# Patient Record
Sex: Female | Born: 1985 | Race: Black or African American | Hispanic: No | Marital: Single | Smoking: Never smoker
Health system: Southern US, Community
[De-identification: ages and names within clinical notes are randomized; demographics above are authoritative.]

## PROBLEM LIST (undated history)

## (undated) DIAGNOSIS — E7800 Pure hypercholesterolemia, unspecified: Secondary | ICD-10-CM

## (undated) DIAGNOSIS — E669 Obesity, unspecified: Secondary | ICD-10-CM

## (undated) DIAGNOSIS — N83209 Unspecified ovarian cyst, unspecified side: Secondary | ICD-10-CM

## (undated) DIAGNOSIS — I10 Essential (primary) hypertension: Secondary | ICD-10-CM

## (undated) DIAGNOSIS — E119 Type 2 diabetes mellitus without complications: Secondary | ICD-10-CM

---
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
## 2005-01-22 ENCOUNTER — Other Ambulatory Visit: Admission: RE | Admit: 2005-01-22 | Discharge: 2005-01-22 | Payer: Self-pay | Admitting: Obstetrics and Gynecology

## 2008-02-28 ENCOUNTER — Emergency Department (HOSPITAL_COMMUNITY): Admission: EM | Admit: 2008-02-28 | Discharge: 2008-02-28 | Payer: Self-pay | Admitting: Family Medicine

## 2008-11-18 ENCOUNTER — Emergency Department (HOSPITAL_COMMUNITY): Admission: EM | Admit: 2008-11-18 | Discharge: 2008-11-18 | Payer: Self-pay | Admitting: Family Medicine

## 2009-03-20 ENCOUNTER — Emergency Department (HOSPITAL_COMMUNITY): Admission: EM | Admit: 2009-03-20 | Discharge: 2009-03-20 | Payer: Self-pay | Admitting: Emergency Medicine

## 2010-04-10 LAB — URINALYSIS, ROUTINE W REFLEX MICROSCOPIC
Bilirubin Urine: NEGATIVE
Glucose, UA: NEGATIVE mg/dL
Protein, ur: NEGATIVE mg/dL
Specific Gravity, Urine: 1.024 (ref 1.005–1.030)
Urobilinogen, UA: 0.2 mg/dL (ref 0.0–1.0)

## 2010-04-10 LAB — URINE MICROSCOPIC-ADD ON

## 2010-04-10 LAB — GC/CHLAMYDIA PROBE AMP, GENITAL: GC Probe Amp, Genital: NEGATIVE

## 2010-04-10 LAB — WET PREP, GENITAL: Yeast Wet Prep HPF POC: NONE SEEN

## 2010-04-10 LAB — POCT PREGNANCY, URINE: Preg Test, Ur: NEGATIVE

## 2010-10-29 ENCOUNTER — Inpatient Hospital Stay (INDEPENDENT_AMBULATORY_CARE_PROVIDER_SITE_OTHER)
Admission: RE | Admit: 2010-10-29 | Discharge: 2010-10-29 | Disposition: A | Discharge status: Home / Self Care | Payer: Self-pay | Source: Ambulatory Visit | Attending: Family Medicine | Admitting: Family Medicine

## 2010-10-29 ENCOUNTER — Emergency Department (HOSPITAL_COMMUNITY): Payer: Self-pay

## 2010-10-29 ENCOUNTER — Emergency Department (HOSPITAL_COMMUNITY)
Admission: EM | Admit: 2010-10-29 | Discharge: 2010-10-29 | Disposition: A | Discharge status: Home / Self Care | Payer: Self-pay | Attending: Emergency Medicine | Admitting: Emergency Medicine

## 2010-10-29 DIAGNOSIS — R197 Diarrhea, unspecified: Secondary | ICD-10-CM | POA: Insufficient documentation

## 2010-10-29 DIAGNOSIS — Z79899 Other long term (current) drug therapy: Secondary | ICD-10-CM | POA: Insufficient documentation

## 2010-10-29 DIAGNOSIS — K219 Gastro-esophageal reflux disease without esophagitis: Secondary | ICD-10-CM | POA: Insufficient documentation

## 2010-10-29 DIAGNOSIS — R109 Unspecified abdominal pain: Secondary | ICD-10-CM

## 2010-10-29 DIAGNOSIS — R10819 Abdominal tenderness, unspecified site: Secondary | ICD-10-CM | POA: Insufficient documentation

## 2010-10-29 DIAGNOSIS — E669 Obesity, unspecified: Secondary | ICD-10-CM | POA: Insufficient documentation

## 2010-10-29 LAB — URINE MICROSCOPIC-ADD ON

## 2010-10-29 LAB — DIFFERENTIAL
Basophils Absolute: 0 10*3/uL (ref 0.0–0.1)
Eosinophils Relative: 5 % (ref 0–5)
Lymphocytes Relative: 42 % (ref 12–46)
Lymphs Abs: 3.3 10*3/uL (ref 0.7–4.0)
Monocytes Absolute: 0.4 10*3/uL (ref 0.1–1.0)
Monocytes Relative: 6 % (ref 3–12)
Neutro Abs: 3.6 10*3/uL (ref 1.7–7.7)

## 2010-10-29 LAB — COMPREHENSIVE METABOLIC PANEL
ALT: 30 U/L (ref 0–35)
AST: 24 U/L (ref 0–37)
Albumin: 3.4 g/dL — ABNORMAL LOW (ref 3.5–5.2)
CO2: 23 mEq/L (ref 19–32)
Calcium: 9.3 mg/dL (ref 8.4–10.5)
Chloride: 102 mEq/L (ref 96–112)
Creatinine, Ser: 0.69 mg/dL (ref 0.50–1.10)
GFR calc non Af Amer: >90 mL/min (ref >90)
Sodium: 136 mEq/L (ref 135–145)
Total Bilirubin: 0.2 mg/dL — ABNORMAL LOW (ref 0.3–1.2)

## 2010-10-29 LAB — POCT URINALYSIS DIP (DEVICE)
Hgb urine dipstick: NEGATIVE
Leukocytes, UA: NEGATIVE
Protein, ur: NEGATIVE mg/dL
pH: 7 (ref 5.0–8.0)

## 2010-10-29 LAB — URINALYSIS, ROUTINE W REFLEX MICROSCOPIC
Glucose, UA: NEGATIVE mg/dL
Hgb urine dipstick: NEGATIVE
Ketones, ur: NEGATIVE mg/dL
Protein, ur: NEGATIVE mg/dL
pH: 6.5 (ref 5.0–8.0)

## 2010-10-29 LAB — CBC
HCT: 39.5 % (ref 36.0–46.0)
Hemoglobin: 13 g/dL (ref 12.0–15.0)
MCH: 26.6 pg (ref 26.0–34.0)
MCHC: 32.9 g/dL (ref 30.0–36.0)
MCV: 80.8 fL (ref 78.0–100.0)
RDW: 13.7 % (ref 11.5–15.5)

## 2010-11-30 ENCOUNTER — Encounter: Payer: Self-pay | Admitting: Nurse Practitioner

## 2010-11-30 ENCOUNTER — Emergency Department (HOSPITAL_COMMUNITY)
Admission: EM | Admit: 2010-11-30 | Discharge: 2010-11-30 | Disposition: A | Discharge status: Home / Self Care | Payer: BC Managed Care – PPO | Attending: Emergency Medicine | Admitting: Emergency Medicine

## 2010-11-30 ENCOUNTER — Emergency Department (HOSPITAL_COMMUNITY): Payer: BC Managed Care – PPO

## 2010-11-30 DIAGNOSIS — X58XXXA Exposure to other specified factors, initial encounter: Secondary | ICD-10-CM | POA: Insufficient documentation

## 2010-11-30 DIAGNOSIS — M25569 Pain in unspecified knee: Secondary | ICD-10-CM | POA: Insufficient documentation

## 2010-11-30 DIAGNOSIS — IMO0002 Reserved for concepts with insufficient information to code with codable children: Secondary | ICD-10-CM | POA: Insufficient documentation

## 2010-11-30 DIAGNOSIS — S8390XA Sprain of unspecified site of unspecified knee, initial encounter: Secondary | ICD-10-CM

## 2010-11-30 DIAGNOSIS — Z79899 Other long term (current) drug therapy: Secondary | ICD-10-CM | POA: Insufficient documentation

## 2010-11-30 NOTE — Progress Notes (Signed)
Orthopedic Tech Progress Note Patient Details:  Charlene Jordan 1985/12/22 [id number]  Other Ortho Devices Type of Ortho Device: Crutches   [name] 11/30/2010, 4:17 PM

## 2010-11-30 NOTE — ED Notes (Signed)
Paged Ortho  

## 2010-11-30 NOTE — ED Notes (Signed)
Onset today 0100 patient attempting to sit down patient heard a pop left knee.  States pain 0/10 at rest and with movement pain 8/10. Pedal pulses +2 full sensation able to move all toes equal.

## 2010-11-30 NOTE — ED Notes (Signed)
C/o left knee pain since she "twisted it" sitting down last night. ambulatory

## 2010-11-30 NOTE — ED Notes (Signed)
Ortho gave crutches and ace bandage.  Ice pack was given upon nurse assessment. Verbalized understanding

## 2010-11-30 NOTE — ED Provider Notes (Signed)
History     CSN: [id number] Arrival date & time: 11/30/2010  3:01 PM   First MD Initiated Contact with Patient 11/30/10 1523      Chief Complaint  Patient presents with  . Knee Pain    (Consider location/radiation/quality/duration/timing/severity/associated sxs/prior treatment) Patient is a 25 y.o. female presenting with knee pain. The history is provided by the patient.  Knee Pain This is a new problem. The current episode started 3 to 5 hours ago. The problem occurs constantly. The problem has not changed since onset.The symptoms are aggravated by bending, twisting and walking. The symptoms are relieved by nothing. She has tried a cold compress and rest for the symptoms. The treatment provided mild relief.    History reviewed. No pertinent past medical history.  History reviewed. No pertinent past surgical history.  History reviewed. No pertinent family history.  History  Substance Use Topics  . Smoking status: Never Smoker   . Smokeless tobacco: Not on file  . Alcohol Use: No    OB History    Grav Para Term Preterm Abortions TAB SAB Ect Mult Living                  Review of Systems  All other systems reviewed and are negative.    Allergies  Review of patient's allergies indicates no known allergies.  Home Medications   Current Outpatient Rx  Name Route Sig Dispense Refill  . OMEPRAZOLE 20 MG PO CPDR Oral Take 20 mg by mouth daily.        BP 147/98  Pulse 98  Temp(Src) 98.2 F (36.8 C) (Oral)  Resp 18  Ht 5\' 7"  (1.702 m)  Wt 300 lb (136.079 kg)  BMI 46.99 kg/m2  SpO2 95%  Physical Exam  Nursing note and vitals reviewed. Constitutional: She is oriented to person, place, and time. She appears well-developed and well-nourished. No distress.  HENT:  Head: Normocephalic and atraumatic.  Musculoskeletal:       Left knee: She exhibits decreased range of motion and bony tenderness. She exhibits no swelling, no deformity, normal alignment, no LCL  laxity, normal patellar mobility and no MCL laxity. tenderness found. Medial joint line tenderness noted.  Neurological: She is alert and oriented to person, place, and time.  Skin: Skin is warm and dry.    ED Course  Procedures (including critical care time)  Labs Reviewed - No data to display Dg Knee Complete 4 Views Left  11/30/2010  *RADIOLOGY REPORT*  Clinical Data: Twisting injury.  Pain.  LEFT KNEE - COMPLETE 4+ VIEW  Comparison: None.  Findings: Imaged bones, joints and soft tissues appear normal.  IMPRESSION: Negative study.  Original Report Authenticated By: Bernadene Bell. Maricela Curet, M.D.     No diagnosis found.    MDM   Pt with knee that twisted and popped while sitting down and lots of pain with walking.  No effusion and plain films neg.  Will have f/u if persistent pain adn give crutches and ace wrap.        Gwyneth Sprout, MD 11/30/10 [phone]

## 2012-12-14 ENCOUNTER — Emergency Department (HOSPITAL_COMMUNITY): Payer: BC Managed Care – PPO

## 2012-12-14 ENCOUNTER — Encounter (HOSPITAL_COMMUNITY): Payer: Self-pay | Admitting: Emergency Medicine

## 2012-12-14 ENCOUNTER — Emergency Department (HOSPITAL_COMMUNITY)
Admission: EM | Admit: 2012-12-14 | Discharge: 2012-12-14 | Disposition: A | Discharge status: Home / Self Care | Payer: BC Managed Care – PPO | Attending: Emergency Medicine | Admitting: Emergency Medicine

## 2012-12-14 DIAGNOSIS — J029 Acute pharyngitis, unspecified: Secondary | ICD-10-CM | POA: Insufficient documentation

## 2012-12-14 DIAGNOSIS — J209 Acute bronchitis, unspecified: Secondary | ICD-10-CM | POA: Insufficient documentation

## 2012-12-14 DIAGNOSIS — E669 Obesity, unspecified: Secondary | ICD-10-CM | POA: Insufficient documentation

## 2012-12-14 DIAGNOSIS — Z79899 Other long term (current) drug therapy: Secondary | ICD-10-CM | POA: Insufficient documentation

## 2012-12-14 DIAGNOSIS — J40 Bronchitis, not specified as acute or chronic: Secondary | ICD-10-CM

## 2012-12-14 DIAGNOSIS — IMO0001 Reserved for inherently not codable concepts without codable children: Secondary | ICD-10-CM | POA: Insufficient documentation

## 2012-12-14 HISTORY — DX: Obesity, unspecified: E66.9

## 2012-12-14 MED ORDER — AZITHROMYCIN 250 MG PO TABS: 250.0000 mg | ORAL_TABLET | Freq: Every day | ORAL | Status: DC

## 2012-12-14 MED ORDER — AZITHROMYCIN 250 MG PO TABS
500.0000 mg | ORAL_TABLET | Freq: Once | ORAL | Status: AC
  Administered 2012-12-14: 500 mg via ORAL
  Filled 2012-12-14: qty 2

## 2012-12-14 MED ORDER — HYDROCOD POLST-CHLORPHEN POLST 10-8 MG/5ML PO LQCR: 5.0000 mL | Freq: Two times a day (BID) | ORAL | Status: DC | PRN

## 2012-12-14 NOTE — ED Notes (Signed)
Pt. reports persistent productive cough with generalized weakness for several days , denies fever or chills, respirations unlabored .

## 2012-12-14 NOTE — ED Provider Notes (Signed)
CSN: [id number]     Arrival date & time 12/14/12  0156 History   First MD Initiated Contact with Patient 12/14/12 0314     Chief Complaint  Patient presents with  . Cough   (Consider location/radiation/quality/duration/timing/severity/associated sxs/prior Treatment) Patient is a 27 y.o. female presenting with cough. The history is provided by the patient.  Cough Cough characteristics:  Productive Sputum characteristics:  Clear and yellow Severity:  Moderate Duration:  10 days Timing:  Constant Progression:  Worsening Chronicity:  New Smoker: no   Context: sick contacts and weather changes   Context: not fumes, not smoke exposure and not with activity   Relieved by:  Nothing Worsened by:  Environmental changes and exposure to cold air Ineffective treatments:  Decongestant and cough suppressants Associated symptoms: chills, fever, myalgias, sinus congestion and sore throat   Associated symptoms: no chest pain and no wheezing     Past Medical History  Diagnosis Date  . Obesity    History reviewed. No pertinent past surgical history. History reviewed. No pertinent family history. History  Substance Use Topics  . Smoking status: Never Smoker   . Smokeless tobacco: Not on file  . Alcohol Use: No   OB History   Grav Para Term Preterm Abortions TAB SAB Ect Mult Living                 Review of Systems  Constitutional: Positive for fever and chills.  HENT: Positive for congestion, postnasal drip and sore throat.   Respiratory: Positive for cough. Negative for wheezing.   Cardiovascular: Negative for chest pain.  Gastrointestinal: Negative for nausea, vomiting and abdominal pain.  Musculoskeletal: Positive for myalgias.    Allergies  Review of patient's allergies indicates no known allergies.  Home Medications   Current Outpatient Rx  Name  Route  Sig  Dispense  Refill  . azithromycin (ZITHROMAX) 250 MG tablet   Oral   Take 1 tablet (250 mg total) by mouth  daily.   4 tablet   0   . chlorpheniramine-HYDROcodone (TUSSIONEX PENNKINETIC ER) 10-8 MG/5ML LQCR   Oral   Take 5 mLs by mouth every 12 (twelve) hours as needed for cough.   80 mL   0   . omeprazole (PRILOSEC) 20 MG capsule   Oral   Take 20 mg by mouth daily.            BP 128/101  Pulse 113  Temp(Src) 99.3 F (37.4 C) (Oral)  Resp 18  SpO2 99%  LMP 10/29/2012 Physical Exam  Nursing note and vitals reviewed. Constitutional: She is oriented to person, place, and time. She appears well-developed and well-nourished. No distress.  HENT:  Head: Normocephalic and atraumatic.  Eyes: Conjunctivae and EOM are normal.  Neck: Normal range of motion. Neck supple.  Cardiovascular: Regular rhythm and intact distal pulses.   Pulmonary/Chest: Effort normal. No respiratory distress. She has no wheezes. She has no rales.  coughing  Abdominal: Soft. Bowel sounds are normal.  Neurological: She is alert and oriented to person, place, and time.  Skin: Skin is warm.  Psychiatric: She has a normal mood and affect.    ED Course  Procedures (including critical care time) Labs Review Labs Reviewed - No data to display Imaging Review No results found.  EKG Interpretation   None      ra sat is 99% and I interpret to be normal   I reviewed 2 view PA and lateral CXR, no overt infiltrates seen, no PTX,  no free air.  Poor inspiration.   MDM   1. Bronchitis       Pt with persistent cough for > 1 week, will try inhaler, cough medication and Z pak.  Pt understands and agrees with treatment.  Mild tachycardia, may be due to illness and low grade fever.  Pt has no CP, rhythm is regular.      Gavin Pound. Oletta Lamas, MD 12/14/12 [phone]

## 2012-12-19 ENCOUNTER — Encounter (HOSPITAL_COMMUNITY): Payer: Self-pay | Admitting: Emergency Medicine

## 2012-12-19 ENCOUNTER — Emergency Department (HOSPITAL_COMMUNITY)
Admission: EM | Admit: 2012-12-19 | Discharge: 2012-12-19 | Disposition: A | Discharge status: Home / Self Care | Payer: BC Managed Care – PPO | Attending: Emergency Medicine | Admitting: Emergency Medicine

## 2012-12-19 DIAGNOSIS — R52 Pain, unspecified: Secondary | ICD-10-CM

## 2012-12-19 DIAGNOSIS — M2550 Pain in unspecified joint: Secondary | ICD-10-CM | POA: Insufficient documentation

## 2012-12-19 DIAGNOSIS — Z792 Long term (current) use of antibiotics: Secondary | ICD-10-CM | POA: Insufficient documentation

## 2012-12-19 DIAGNOSIS — E876 Hypokalemia: Secondary | ICD-10-CM | POA: Insufficient documentation

## 2012-12-19 DIAGNOSIS — IMO0001 Reserved for inherently not codable concepts without codable children: Secondary | ICD-10-CM | POA: Insufficient documentation

## 2012-12-19 DIAGNOSIS — E669 Obesity, unspecified: Secondary | ICD-10-CM | POA: Insufficient documentation

## 2012-12-19 LAB — CBC WITH DIFFERENTIAL/PLATELET
Basophils Absolute: 0 10*3/uL (ref 0.0–0.1)
Basophils Relative: 0 % (ref 0–1)
HCT: 34.4 % — ABNORMAL LOW (ref 36.0–46.0)
MCH: 25.4 pg — ABNORMAL LOW (ref 26.0–34.0)
MCHC: 31.4 g/dL (ref 30.0–36.0)
MCV: 80.9 fL (ref 78.0–100.0)
Monocytes Relative: 8 % (ref 3–12)
Neutro Abs: 3.7 10*3/uL (ref 1.7–7.7)
Neutrophils Relative %: 57 % (ref 43–77)
RBC: 4.25 MIL/uL (ref 3.87–5.11)
RDW: 14.2 % (ref 11.5–15.5)
WBC: 6.4 10*3/uL (ref 4.0–10.5)

## 2012-12-19 LAB — POCT I-STAT, CHEM 8
Chloride: 99 mEq/L (ref 96–112)
Glucose, Bld: 106 mg/dL — ABNORMAL HIGH (ref 70–99)
HCT: 37 % (ref 36.0–46.0)
Hemoglobin: 12.6 g/dL (ref 12.0–15.0)
Potassium: 3 mEq/L — ABNORMAL LOW (ref 3.5–5.1)
TCO2: 27 mmol/L (ref 0–100)

## 2012-12-19 MED ORDER — KETOROLAC TROMETHAMINE 60 MG/2ML IM SOLN
60.0000 mg | Freq: Once | INTRAMUSCULAR | Status: AC
  Administered 2012-12-19: 60 mg via INTRAMUSCULAR
  Filled 2012-12-19: qty 2

## 2012-12-19 MED ORDER — POTASSIUM CHLORIDE CRYS ER 20 MEQ PO TBCR
40.0000 meq | EXTENDED_RELEASE_TABLET | Freq: Once | ORAL | Status: AC
  Administered 2012-12-19: 40 meq via ORAL
  Filled 2012-12-19 (×2): qty 2

## 2012-12-19 NOTE — ED Provider Notes (Signed)
Medical screening examination/treatment/procedure(s) were performed by non-physician practitioner and as supervising physician I was immediately available for consultation/collaboration.  Vetra Shinall T Alassane Kalafut, MD 12/19/12 0526 

## 2012-12-19 NOTE — ED Notes (Signed)
Pt. reports generalized body aches onset last night , denies fever or chills, pt. was seen here last Saturday - diagnosed with Bronchitis discharged home with prescription Z-Pack . Respirations unlabored .

## 2012-12-19 NOTE — ED Provider Notes (Signed)
CSN: [id number]     Arrival date & time 12/19/12  0133 History   First MD Initiated Contact with Patient 12/19/12 0148     Chief Complaint  Patient presents with  . Generalized Body Aches   (Consider location/radiation/quality/duration/timing/severity/associated sxs/prior Treatment) HPI Charlene Jordan is a 27 y.o. female who presents to emergency department complaining of diffuse body aches. Patient states she has had cough and congestion for several days, was seen here four days ago was started on Tussionex and Z-Pak. States had chest x-ray done that was negative. States as the day after she started taking medications she started having pain in all muscles and joints. States pain is severe, dull, throbbing. States it's not relieved with BC powder or ibuprofen. Patient states she has never had similar pain in the past. She states that her cough is actually improved with taking medication. Patient denies any fever. Denies any congestion, nausea, vomiting, diarrhea. No chest pain. No dizziness.  Past Medical History  Diagnosis Date  . Obesity    History reviewed. No pertinent past surgical history. No family history on file. History  Substance Use Topics  . Smoking status: Never Smoker   . Smokeless tobacco: Not on file  . Alcohol Use: No   OB History   Grav Para Term Preterm Abortions TAB SAB Ect Mult Living                 Review of Systems  Constitutional: Negative for fever and chills.  Respiratory: Negative for cough, chest tightness and shortness of breath.   Cardiovascular: Negative for chest pain, palpitations and leg swelling.  Gastrointestinal: Negative for nausea, vomiting, abdominal pain and diarrhea.  Genitourinary: Negative for dysuria and flank pain.  Musculoskeletal: Positive for arthralgias and myalgias. Negative for back pain, joint swelling, neck pain and neck stiffness.  Skin: Negative for rash.  Neurological: Negative for dizziness, weakness and headaches.   All other systems reviewed and are negative.    Allergies  Review of patient's allergies indicates no known allergies.  Home Medications   Current Outpatient Rx  Name  Route  Sig  Dispense  Refill  . acetaminophen (TYLENOL) 325 MG tablet   Oral   Take 650 mg by mouth every 6 (six) hours as needed for mild pain.         . Chlorphen-Phenyleph-ASA (ALKA-SELTZER PLUS COLD PO)   Oral   Take 1 tablet by mouth every 6 (six) hours as needed (cold symptoms).         . chlorpheniramine-HYDROcodone (TUSSIONEX PENNKINETIC ER) 10-8 MG/5ML LQCR   Oral   Take 5 mLs by mouth every 12 (twelve) hours as needed for cough.   80 mL   0   . azithromycin (ZITHROMAX) 250 MG tablet   Oral   Take 1 tablet (250 mg total) by mouth daily.   4 tablet   0    BP 125/68  Pulse 94  Temp(Src) 98.9 F (37.2 C) (Oral)  Resp 14  Ht 5\' 8"  (1.727 m)  Wt 313 lb (141.976 kg)  BMI 47.60 kg/m2  SpO2 99%  LMP 10/29/2012 Physical Exam  Nursing note and vitals reviewed. Constitutional: She is oriented to person, place, and time. She appears well-developed and well-nourished. No distress.  HENT:  Head: Normocephalic.  Right Ear: External ear normal.  Left Ear: External ear normal.  Nose: Nose normal.  Mouth/Throat: Oropharynx is clear and moist.  Eyes: Conjunctivae are normal.  Neck: Neck supple.  Cardiovascular: Normal rate,  regular rhythm and normal heart sounds.   Pulmonary/Chest: Effort normal and breath sounds normal. No respiratory distress. She has no wheezes. She has no rales.  Abdominal: Soft. Bowel sounds are normal. She exhibits no distension. There is no tenderness. There is no rebound.  Musculoskeletal: She exhibits no edema.  Neurological: She is alert and oriented to person, place, and time.  Skin: Skin is warm and dry.  Psychiatric: She has a normal mood and affect. Her behavior is normal.    ED Course  Procedures (including critical care time) Labs Review Labs Reviewed  CBC  WITH DIFFERENTIAL - Abnormal; Notable for the following:    Hemoglobin 10.8 (*)    HCT 34.4 (*)    MCH 25.4 (*)    All other components within normal limits  POCT I-STAT, CHEM 8 - Abnormal; Notable for the following:    Potassium 3.0 (*)    Glucose, Bld 106 (*)    Calcium, Ion 1.11 (*)    All other components within normal limits  CK   Imaging Review No results found.  EKG Interpretation   None       MDM   1. Body aches   2. Hypokalemia     Patient's with bodyaches after starting a Z-Pak. Patient is now down with Z-Pak. States her cough and upper respiratory symptoms improved but body aches continue. Today she's afebrile. Vital signs are all within normal. Labs and CK total obtained and everything is unremarkable except for hemoglobin of 10.8, potassium of 3, calcium of 1.11. I have given her 40 mEq of potassium in emergency department, Toradol 60 mg IM. I suspect her anemia is chronic. She will need followup with primary care physician. I suspect her muscle aches could be do to a viral infection versus medication side effect.  Filed Vitals:   12/19/12 0141  BP: 125/68  Pulse: 94  Temp: 98.9 F (37.2 C)  TempSrc: Oral  Resp: 14  Height: 5\' 8"  (1.727 m)  Weight: 313 lb (141.976 kg)  SpO2: 99%     Lottie Mussel, PA-C 12/19/12 0344

## 2014-04-22 ENCOUNTER — Emergency Department
Admit: 2014-04-22 | Disposition: A | Discharge status: Home / Self Care | Payer: Self-pay | Admitting: Emergency Medicine

## 2014-06-23 ENCOUNTER — Encounter: Payer: Self-pay | Admitting: Emergency Medicine

## 2014-06-23 ENCOUNTER — Emergency Department
Admission: EM | Admit: 2014-06-23 | Discharge: 2014-06-23 | Disposition: A | Discharge status: Home / Self Care | Payer: BLUE CROSS/BLUE SHIELD | Attending: Emergency Medicine | Admitting: Emergency Medicine

## 2014-06-23 ENCOUNTER — Emergency Department: Payer: BLUE CROSS/BLUE SHIELD

## 2014-06-23 DIAGNOSIS — R1110 Vomiting, unspecified: Secondary | ICD-10-CM | POA: Diagnosis not present

## 2014-06-23 DIAGNOSIS — Z3202 Encounter for pregnancy test, result negative: Secondary | ICD-10-CM | POA: Insufficient documentation

## 2014-06-23 DIAGNOSIS — R109 Unspecified abdominal pain: Secondary | ICD-10-CM

## 2014-06-23 DIAGNOSIS — R1031 Right lower quadrant pain: Secondary | ICD-10-CM | POA: Insufficient documentation

## 2014-06-23 LAB — CBC
HEMATOCRIT: 34.5 % — AB (ref 35.0–47.0)
Hemoglobin: 10.9 g/dL — ABNORMAL LOW (ref 12.0–16.0)
MCH: 24.2 pg — ABNORMAL LOW (ref 26.0–34.0)
MCHC: 31.6 g/dL — ABNORMAL LOW (ref 32.0–36.0)
MCV: 76.7 fL — AB (ref 80.0–100.0)
PLATELETS: 249 10*3/uL (ref 150–440)
RBC: 4.5 MIL/uL (ref 3.80–5.20)
RDW: 14.8 % — ABNORMAL HIGH (ref 11.5–14.5)
WBC: 8.1 10*3/uL (ref 3.6–11.0)

## 2014-06-23 LAB — URINALYSIS COMPLETE WITH MICROSCOPIC (ARMC ONLY)
BILIRUBIN URINE: NEGATIVE
Glucose, UA: NEGATIVE mg/dL
Hgb urine dipstick: NEGATIVE
Ketones, ur: NEGATIVE mg/dL
LEUKOCYTES UA: NEGATIVE
NITRITE: NEGATIVE
Protein, ur: 30 mg/dL — AB
Specific Gravity, Urine: 1.017 (ref 1.005–1.030)
pH: 9 — ABNORMAL HIGH (ref 5.0–8.0)

## 2014-06-23 LAB — BASIC METABOLIC PANEL
Anion gap: 8 (ref 5–15)
BUN: 10 mg/dL (ref 6–20)
CO2: 27 mmol/L (ref 22–32)
Calcium: 8.7 mg/dL — ABNORMAL LOW (ref 8.9–10.3)
Chloride: 102 mmol/L (ref 101–111)
Creatinine, Ser: 0.86 mg/dL (ref 0.44–1.00)
GFR calc Af Amer: >60 mL/min (ref >60)
GFR calc non Af Amer: >60 mL/min (ref >60)
Glucose, Bld: 108 mg/dL — ABNORMAL HIGH (ref 65–99)
POTASSIUM: 3.5 mmol/L (ref 3.5–5.1)
Sodium: 137 mmol/L (ref 135–145)

## 2014-06-23 LAB — POCT PREGNANCY, URINE: Preg Test, Ur: NEGATIVE

## 2014-06-23 MED ORDER — KETOROLAC TROMETHAMINE 60 MG/2ML IM SOLN
60.0000 mg | Freq: Once | INTRAMUSCULAR | Status: AC
  Administered 2014-06-23: 60 mg via INTRAMUSCULAR

## 2014-06-23 MED ORDER — ONDANSETRON 8 MG PO TBDP
8.0000 mg | ORAL_TABLET | Freq: Once | ORAL | Status: AC
  Administered 2014-06-23: 8 mg via ORAL

## 2014-06-23 MED ORDER — KETOROLAC TROMETHAMINE 60 MG/2ML IM SOLN
INTRAMUSCULAR | Status: AC
  Administered 2014-06-23: 60 mg via INTRAMUSCULAR
  Filled 2014-06-23: qty 2

## 2014-06-23 MED ORDER — MORPHINE SULFATE 4 MG/ML IJ SOLN
4.0000 mg | Freq: Once | INTRAMUSCULAR | Status: AC
Start: 2014-06-23 — End: 2014-06-23
  Administered 2014-06-23: 4 mg via INTRAMUSCULAR

## 2014-06-23 MED ORDER — IBUPROFEN 600 MG PO TABS: 600.0000 mg | ORAL_TABLET | Freq: Three times a day (TID) | ORAL | Status: DC | PRN

## 2014-06-23 MED ORDER — MORPHINE SULFATE 4 MG/ML IJ SOLN
INTRAMUSCULAR | Status: AC
  Filled 2014-06-23: qty 1

## 2014-06-23 MED ORDER — MORPHINE SULFATE 4 MG/ML IJ SOLN
INTRAMUSCULAR | Status: AC
  Administered 2014-06-23: 4 mg via INTRAMUSCULAR
  Filled 2014-06-23: qty 1

## 2014-06-23 MED ORDER — ONDANSETRON 8 MG PO TBDP
ORAL_TABLET | ORAL | Status: AC
  Administered 2014-06-23: 8 mg via ORAL
  Filled 2014-06-23: qty 1

## 2014-06-23 NOTE — ED Notes (Signed)
Patient ambulatory to triage with steady gait, without difficulty or distress noted; pt reports since 445am having right flank pain accomp by N/V

## 2014-06-23 NOTE — Discharge Instructions (Signed)
Please seek medical attention for any high fevers, chest pain, shortness of breath, change in behavior, persistent vomiting, bloody stool or any other new or concerning symptoms. ° °Abdominal Pain, Women °Abdominal (stomach, pelvic, or belly) pain can be caused by many things. It is important to tell your doctor: °· The location of the pain. °· Does it come and go or is it present all the time? °· Are there things that start the pain (eating certain foods, exercise)? °· Are there other symptoms associated with the pain (fever, nausea, vomiting, diarrhea)? °All of this is helpful to know when trying to find the cause of the pain. °CAUSES  °· Stomach: virus or bacteria infection, or ulcer. °· Intestine: appendicitis (inflamed appendix), regional ileitis (Crohn's disease), ulcerative colitis (inflamed colon), irritable bowel syndrome, diverticulitis (inflamed diverticulum of the colon), or cancer of the stomach or intestine. °· Gallbladder disease or stones in the gallbladder. °· Kidney disease, kidney stones, or infection. °· Pancreas infection or cancer. °· Fibromyalgia (pain disorder). °· Diseases of the female organs: °¨ Uterus: fibroid (non-cancerous) tumors or infection. °¨ Fallopian tubes: infection or tubal pregnancy. °¨ Ovary: cysts or tumors. °¨ Pelvic adhesions (scar tissue). °¨ Endometriosis (uterus lining tissue growing in the pelvis and on the pelvic organs). °¨ Pelvic congestion syndrome (female organs filling up with blood just before the menstrual period). °¨ Pain with the menstrual period. °¨ Pain with ovulation (producing an egg). °¨ Pain with an IUD (intrauterine device, birth control) in the uterus. °¨ Cancer of the female organs. °· Functional pain (pain not caused by a disease, may improve without treatment). °· Psychological pain. °· Depression. °DIAGNOSIS  °Your doctor will decide the seriousness of your pain by doing an examination. °· Blood tests. °· X-rays. °· Ultrasound. °· CT scan  (computed tomography, special type of X-ray). °· MRI (magnetic resonance imaging). °· Cultures, for infection. °· Barium enema (dye inserted in the large intestine, to better view it with X-rays). °· Colonoscopy (looking in intestine with a lighted tube). °· Laparoscopy (minor surgery, looking in abdomen with a lighted tube). °· Major abdominal exploratory surgery (looking in abdomen with a large incision). °TREATMENT  °The treatment will depend on the cause of the pain.  °· Many cases can be observed and treated at home. °· Over-the-counter medicines recommended by your caregiver. °· Prescription medicine. °· Antibiotics, for infection. °· Birth control pills, for painful periods or for ovulation pain. °· Hormone treatment, for endometriosis. °· Nerve blocking injections. °· Physical therapy. °· Antidepressants. °· Counseling with a psychologist or psychiatrist. °· Minor or major surgery. °HOME CARE INSTRUCTIONS  °· Do not take laxatives, unless directed by your caregiver. °· Take over-the-counter pain medicine only if ordered by your caregiver. Do not take aspirin because it can cause an upset stomach or bleeding. °· Try a clear liquid diet (broth or water) as ordered by your caregiver. Slowly move to a bland diet, as tolerated, if the pain is related to the stomach or intestine. °· Have a thermometer and take your temperature several times a day, and record it. °· Bed rest and sleep, if it helps the pain. °· Avoid sexual intercourse, if it causes pain. °· Avoid stressful situations. °· Keep your follow-up appointments and tests, as your caregiver orders. °· If the pain does not go away with medicine or surgery, you may try: °¨ Acupuncture. °¨ Relaxation exercises (yoga, meditation). °¨ Group therapy. °¨ Counseling. °SEEK MEDICAL CARE IF:  °· You notice certain foods cause stomach   pain. °· Your home care treatment is not helping your pain. °· You need stronger pain medicine. °· You want your IUD removed. °· You  feel faint or lightheaded. °· You develop nausea and vomiting. °· You develop a rash. °· You are having side effects or an allergy to your medicine. °SEEK IMMEDIATE MEDICAL CARE IF:  °· Your pain does not go away or gets worse. °· You have a fever. °· Your pain is felt only in portions of the abdomen. The right side could possibly be appendicitis. The left lower portion of the abdomen could be colitis or diverticulitis. °· You are passing blood in your stools (bright red or black tarry stools, with or without vomiting). °· You have blood in your urine. °· You develop chills, with or without a fever. °· You pass out. °MAKE SURE YOU:  °· Understand these instructions. °· Will watch your condition. °· Will get help right away if you are not doing well or get worse. °Document Released: 10/29/2006 Document Revised: 05/18/2013 Document Reviewed: 11/18/2008 °[employer]® Patient Information ©2015 [employer]. This information is not intended to replace advice given to you by your health care provider. Make sure you discuss any questions you have with your health care provider. ° °

## 2014-06-23 NOTE — ED Provider Notes (Signed)
[facility] Emergency Department Provider Note   ____________________________________________  Time seen: 0745  I have reviewed the triage vital signs and the nursing notes.   HISTORY  Chief Complaint Flank Pain   History limited by: Not Limited   HPI Jamison Neighborshley Brummell is a 29 y.o. female resents to the emergency department today with right flank pain. The patient states the flank pain started roughly 3 hours ago. It was sudden on onset. She describes it as sharp. It has some radiation down to the groin. Patient had similar pain in the past when she was diagnosed with an ovarian cyst. She has had some associated vomiting. Patient denies any fevers, chest pain.     History reviewed. No pertinent past medical history.  There are no active problems to display for this patient.   History reviewed. No pertinent past surgical history.  Current Outpatient Rx  Name  Route  Sig  Dispense  Refill  . ibuprofen (ADVIL,MOTRIN) 600 MG tablet   Oral   Take 1 tablet (600 mg total) by mouth every 8 (eight) hours as needed.   20 tablet   0     Allergies Doxycycline  No family history on file.  Social History History  Substance Use Topics  . Smoking status: Never Smoker   . Smokeless tobacco: Not on file  . Alcohol Use: No    Review of Systems  Constitutional: Negative for fever. Cardiovascular: Negative for chest pain. Respiratory: Negative for shortness of breath. Gastrointestinal: Right flank pain, vomiting Genitourinary: Negative for dysuria. Musculoskeletal: Negative for back pain. Skin: Negative for rash. Neurological: Negative for headaches, focal weakness or numbness.   10-point ROS otherwise negative.  ____________________________________________   PHYSICAL EXAM:  VITAL SIGNS: ED Triage Vitals  Enc Vitals Group     BP 06/23/14 0558 137/93 mmHg     Pulse Rate 06/23/14 0558 86     Resp 06/23/14 0558 18     Temp 06/23/14 0558 98  F (36.7 C)     Temp Source 06/23/14 0558 Oral     SpO2 06/23/14 0558 98 %     Weight 06/23/14 0558 315 lb (142.883 kg)     Height 06/23/14 0558 5\' 7"  (1.702 m)     Head Cir --      Peak Flow --      Pain Score 06/23/14 0602 10   Constitutional: Alert and oriented. Well appearing and in no distress. Eyes: Conjunctivae are normal. PERRL. Normal extraocular movements. ENT   Head: Normocephalic and atraumatic.   Nose: No congestion/rhinnorhea.   Mouth/Throat: Mucous membranes are moist.   Neck: No stridor. Hematological/Lymphatic/Immunilogical: No cervical lymphadenopathy. Cardiovascular: Normal rate, regular rhythm.  Respiratory: Normal respiratory effort without tachypnea nor retractions.  Gastrointestinal: Soft and minimally tender to palpation in the right lower quadrant. No distention.  Genitourinary: Deferred Musculoskeletal: Normal range of motion in all extremities. No joint effusions.  No lower extremity tenderness nor edema. Neurologic:  Normal speech and language. No gross focal neurologic deficits are appreciated. Speech is normal.  Skin:  Skin is warm, dry and intact. No rash noted. Psychiatric: Mood and affect are normal. Speech and behavior are normal. Patient exhibits appropriate insight and judgment.  ____________________________________________    LABS (pertinent positives/negatives)  Labs Reviewed  BASIC METABOLIC PANEL - Abnormal; Notable for the following:    Glucose, Bld 108 (*)    Calcium 8.7 (*)    All other components within normal limits  CBC - Abnormal; Notable for  the following:    Hemoglobin 10.9 (*)    HCT 34.5 (*)    MCV 76.7 (*)    MCH 24.2 (*)    MCHC 31.6 (*)    RDW 14.8 (*)    All other components within normal limits  URINALYSIS COMPLETEWITH MICROSCOPIC ([facility] ONLY) - Abnormal; Notable for the following:    Color, Urine YELLOW (*)    APPearance HAZY (*)    pH 9.0 (*)    Protein, ur 30 (*)    Bacteria, UA RARE (*)     Squamous Epithelial / LPF 6-30 (*)    All other components within normal limits  POC URINE PREG, ED  POCT PREGNANCY, URINE     ____________________________________________   EKG  None  ____________________________________________    RADIOLOGY  None  ____________________________________________   PROCEDURES  Procedure(s) performed: None  Critical Care performed: No  ____________________________________________   INITIAL IMPRESSION / ASSESSMENT AND PLAN / ED COURSE  Pertinent labs & imaging results that were available during my care of the patient were reviewed by me and considered in my medical decision making (see chart for details).  Patient presents to the emergency department with right flank pain. On exam minimally tender in the right lower quadrant and right flank. Patient afebrile. No leukocytosis. I have low suspicion for appendicitis at this point. Reviewed CT scan results from April they did not show any ureterolithiasis. At this point will treat symptomatically and reassess.  Ultrasound does show a small ovarian cyst. No signs of torsion. Discussed with patient that I have low suspicion for appendicitis given lack of fever and leukocytosis. Also discussed low suspicion for kidney stone given CT scan that did not show kidney stones recently as well as no blood in the urine. Patient verbalized understanding. Also discussed with patient my hesitancy to prescribe narcotic pain medication given the addictive and respiratory depression properties. Will give prescription for high-dose ibuprofen.  ____________________________________________   FINAL CLINICAL IMPRESSION(S) / ED DIAGNOSES  Final diagnoses:  Flank pain  Right lower quadrant pain  Right lower quadrant pain     Phineas Semen, MD 06/23/14 1334

## 2014-06-24 ENCOUNTER — Observation Stay (HOSPITAL_COMMUNITY)
Admission: EM | Admit: 2014-06-24 | Discharge: 2014-06-25 | Disposition: A | Discharge status: Home / Self Care | Payer: BLUE CROSS/BLUE SHIELD | Attending: Obstetrics and Gynecology | Admitting: Obstetrics and Gynecology

## 2014-06-24 ENCOUNTER — Emergency Department (HOSPITAL_COMMUNITY): Payer: BLUE CROSS/BLUE SHIELD

## 2014-06-24 ENCOUNTER — Encounter (HOSPITAL_COMMUNITY): Payer: Self-pay | Admitting: Emergency Medicine

## 2014-06-24 DIAGNOSIS — R112 Nausea with vomiting, unspecified: Secondary | ICD-10-CM

## 2014-06-24 DIAGNOSIS — R102 Pelvic and perineal pain unspecified side: Secondary | ICD-10-CM | POA: Diagnosis present

## 2014-06-24 DIAGNOSIS — R1031 Right lower quadrant pain: Secondary | ICD-10-CM | POA: Diagnosis present

## 2014-06-24 LAB — CBC WITH DIFFERENTIAL/PLATELET
BASOS PCT: 0 % (ref 0–1)
Basophils Absolute: 0 10*3/uL (ref 0.0–0.1)
Eosinophils Absolute: 0 10*3/uL (ref 0.0–0.7)
Eosinophils Relative: 0 % (ref 0–5)
HEMATOCRIT: 37.3 % (ref 36.0–46.0)
Hemoglobin: 11.7 g/dL — ABNORMAL LOW (ref 12.0–15.0)
LYMPHS PCT: 12 % (ref 12–46)
Lymphs Abs: 1.5 10*3/uL (ref 0.7–4.0)
MCH: 24.6 pg — ABNORMAL LOW (ref 26.0–34.0)
MCHC: 31.4 g/dL (ref 30.0–36.0)
MCV: 78.4 fL (ref 78.0–100.0)
MONO ABS: 0.6 10*3/uL (ref 0.1–1.0)
Monocytes Relative: 5 % (ref 3–12)
Neutro Abs: 10.3 10*3/uL — ABNORMAL HIGH (ref 1.7–7.7)
Neutrophils Relative %: 83 % — ABNORMAL HIGH (ref 43–77)
PLATELETS: 281 10*3/uL (ref 150–400)
RBC: 4.76 MIL/uL (ref 3.87–5.11)
RDW: 13.8 % (ref 11.5–15.5)
WBC: 12.3 10*3/uL — ABNORMAL HIGH (ref 4.0–10.5)

## 2014-06-24 LAB — URINALYSIS, ROUTINE W REFLEX MICROSCOPIC
BILIRUBIN URINE: NEGATIVE
Glucose, UA: NEGATIVE mg/dL
HGB URINE DIPSTICK: NEGATIVE
Ketones, ur: 40 mg/dL — AB
LEUKOCYTES UA: NEGATIVE
Nitrite: NEGATIVE
PH: 7 (ref 5.0–8.0)
Protein, ur: 30 mg/dL — AB
Specific Gravity, Urine: 1.028 (ref 1.005–1.030)
UROBILINOGEN UA: 0.2 mg/dL (ref 0.0–1.0)

## 2014-06-24 LAB — COMPREHENSIVE METABOLIC PANEL
ALT: 24 U/L (ref 14–54)
ANION GAP: 13 (ref 5–15)
AST: 21 U/L (ref 15–41)
Albumin: 4 g/dL (ref 3.5–5.0)
Alkaline Phosphatase: 87 U/L (ref 38–126)
BUN: 8 mg/dL (ref 6–20)
CALCIUM: 9.2 mg/dL (ref 8.9–10.3)
CO2: 24 mmol/L (ref 22–32)
CREATININE: 0.88 mg/dL (ref 0.44–1.00)
Chloride: 98 mmol/L — ABNORMAL LOW (ref 101–111)
GFR calc Af Amer: >60 mL/min (ref >60)
GFR calc non Af Amer: >60 mL/min (ref >60)
Glucose, Bld: 159 mg/dL — ABNORMAL HIGH (ref 65–99)
POTASSIUM: 3.7 mmol/L (ref 3.5–5.1)
SODIUM: 135 mmol/L (ref 135–145)
Total Bilirubin: 0.5 mg/dL (ref 0.3–1.2)
Total Protein: 8.4 g/dL — ABNORMAL HIGH (ref 6.5–8.1)

## 2014-06-24 LAB — URINE MICROSCOPIC-ADD ON

## 2014-06-24 LAB — LIPASE, BLOOD: Lipase: 15 U/L — ABNORMAL LOW (ref 22–51)

## 2014-06-24 LAB — GC/CHLAMYDIA PROBE AMP (~~LOC~~) NOT AT ARMC
CHLAMYDIA, DNA PROBE: NEGATIVE
Neisseria Gonorrhea: NEGATIVE

## 2014-06-24 LAB — WET PREP, GENITAL
CLUE CELLS WET PREP: NONE SEEN
TRICH WET PREP: NONE SEEN
WBC WET PREP: NONE SEEN
Yeast Wet Prep HPF POC: NONE SEEN

## 2014-06-24 LAB — PREGNANCY, URINE: Preg Test, Ur: NEGATIVE

## 2014-06-24 MED ORDER — ONDANSETRON HCL 4 MG/2ML IJ SOLN: 4.0000 mg | Freq: Four times a day (QID) | INTRAMUSCULAR | Status: DC | PRN

## 2014-06-24 MED ORDER — KETOROLAC TROMETHAMINE 30 MG/ML IJ SOLN
30.0000 mg | Freq: Four times a day (QID) | INTRAMUSCULAR | Status: DC
Start: 2014-06-24 — End: 2014-06-25
  Administered 2014-06-24 – 2014-06-25 (×6): 30 mg via INTRAVENOUS
  Filled 2014-06-24 (×6): qty 1

## 2014-06-24 MED ORDER — HYDROMORPHONE HCL 1 MG/ML IJ SOLN
1.0000 mg | INTRAMUSCULAR | Status: DC | PRN
  Administered 2014-06-24 – 2014-06-25 (×4): 1 mg via INTRAVENOUS
  Filled 2014-06-24 (×4): qty 1

## 2014-06-24 MED ORDER — LACTATED RINGERS IV SOLN
INTRAVENOUS | Status: DC
  Administered 2014-06-24 – 2014-06-25 (×2): via INTRAVENOUS

## 2014-06-24 MED ORDER — IOHEXOL 300 MG/ML  SOLN
25.0000 mL | Freq: Once | INTRAMUSCULAR | Status: AC | PRN
  Administered 2014-06-24: 25 mL via ORAL

## 2014-06-24 MED ORDER — ONDANSETRON HCL 4 MG PO TABS: 4.0000 mg | ORAL_TABLET | Freq: Four times a day (QID) | ORAL | Status: DC | PRN

## 2014-06-24 MED ORDER — ALUM & MAG HYDROXIDE-SIMETH 200-200-20 MG/5ML PO SUSP: 30.0000 mL | ORAL | Status: DC | PRN

## 2014-06-24 MED ORDER — SIMETHICONE 80 MG PO CHEW: 80.0000 mg | CHEWABLE_TABLET | Freq: Four times a day (QID) | ORAL | Status: DC | PRN

## 2014-06-24 MED ORDER — DEXTROSE 5 % IV SOLN
1.0000 g | Freq: Once | INTRAVENOUS | Status: AC
  Administered 2014-06-24: 1 g via INTRAVENOUS
  Filled 2014-06-24: qty 10

## 2014-06-24 MED ORDER — FENTANYL CITRATE (PF) 100 MCG/2ML IJ SOLN
50.0000 ug | INTRAMUSCULAR | Status: DC | PRN
  Administered 2014-06-24 (×2): 50 ug via INTRAVENOUS
  Filled 2014-06-24 (×2): qty 2

## 2014-06-24 MED ORDER — SODIUM CHLORIDE 0.9 % IV SOLN
INTRAVENOUS | Status: DC
  Administered 2014-06-24 (×2): via INTRAVENOUS

## 2014-06-24 MED ORDER — ONDANSETRON HCL 4 MG/2ML IJ SOLN
4.0000 mg | Freq: Once | INTRAMUSCULAR | Status: AC
  Administered 2014-06-24: 4 mg via INTRAVENOUS
  Filled 2014-06-24: qty 2

## 2014-06-24 MED ORDER — KETOROLAC TROMETHAMINE 30 MG/ML IJ SOLN
30.0000 mg | Freq: Once | INTRAMUSCULAR | Status: AC
  Administered 2014-06-24: 30 mg via INTRAVENOUS
  Filled 2014-06-24: qty 1

## 2014-06-24 MED ORDER — IOHEXOL 300 MG/ML  SOLN
100.0000 mL | Freq: Once | INTRAMUSCULAR | Status: AC | PRN
  Administered 2014-06-24: 100 mL via INTRAVENOUS

## 2014-06-24 NOTE — ED Notes (Signed)
Pt assigned room 309

## 2014-06-24 NOTE — Progress Notes (Signed)
Pain fairly well controlled, Toradol and Dilaudid are helping Afeb, VSS Abd- soft, tender BLQ-R>L, no mass, no rebound Will continue IV pain meds overnight, try to switch to oral meds tomorrow.  She does not have an acute abdomen, so will try to hold off on surgery for now.

## 2014-06-24 NOTE — ED Notes (Signed)
Patient states she has HX of ovarian cyst. States was seen at [facility] advised to follow up with GY MD. States she did and was given  Medication and medication does not seem to work. Patient states she has pain with urination denise an odor and burning.

## 2014-06-24 NOTE — H&P (Signed)
Charlene Jordan is an 29 y.o. female. She was seen in our office in April for annual gyn exam, c/o some pelvic pain.  Pelvic u/s in the office in April with complex ovarian masses of ? Etiology.  Pain has been intermittent, but recent increased right flank pain.  Seen at [facility] yesterday, report of pelvic u/s there just with simple ovarian cysts.  She was evaluated and sent home, but called the office for pain medication.  She then went to [facility] last pm for continued pain.  CT scan there with complex ovarian masses, sent to [facility] for management after discussion with Dr. Senaida Ores.  When I spoke with her earlier, pain was improved.  She is having regular menses monthly, is not sexually active.  Neg GC/CT in April.    Pertinent Gynecological History: OB History: G0, P0   Menstrual History: Patient's last menstrual period was 05/22/2014.    History reviewed. No pertinent past medical history.  History reviewed. No pertinent past surgical history.  No family history on file.  Social History:  reports that she has never smoked. She does not have any smokeless tobacco history on file. She reports that she does not drink alcohol or use illicit drugs.  Allergies:  Allergies  Allergen Reactions  . Doxycycline Nausea And Vomiting    Prescriptions prior to admission  Medication Sig Dispense Refill Last Dose  . ibuprofen (ADVIL,MOTRIN) 600 MG tablet Take 1 tablet (600 mg total) by mouth every 8 (eight) hours as needed. 20 tablet 0 06/23/2014 at Unknown time    Review of Systems  Respiratory: Negative.   Cardiovascular: Negative.     Blood pressure 143/76, pulse 77, temperature 98.3 F (36.8 C), temperature source Oral, resp. rate 16, height 5' 7.5" (1.715 m), weight 142.883 kg (315 lb), last menstrual period 05/22/2014, SpO2 100 %. Physical Exam  Vitals reviewed. Constitutional: She appears well-developed and well-nourished.  Cardiovascular: Normal rate, regular rhythm and normal heart sounds.    No murmur heard. Respiratory: Effort normal and breath sounds normal. No respiratory distress. She has no wheezes.  GI: Soft. She exhibits no distension and no mass. There is tenderness. There is no rebound.  Genitourinary:  Deferred due to body habitus and recent radiologic studies    Results for orders placed or performed during the hospital encounter of 06/24/14 (from the past 24 hour(s))  GC/Chlamydia probe amp (Frederic)not at [facility]     Status: None   Collection Time: 06/24/14 12:00 AM  Result Value Ref Range   Chlamydia Negative    Neisseria gonorrhea Negative   CBC with Differential     Status: Abnormal   Collection Time: 06/24/14  2:31 AM  Result Value Ref Range   WBC 12.3 (H) 4.0 - 10.5 K/uL   RBC 4.76 3.87 - 5.11 MIL/uL   Hemoglobin 11.7 (L) 12.0 - 15.0 g/dL   HCT 58.5 27.7 - 82.4 %   MCV 78.4 78.0 - 100.0 fL   MCH 24.6 (L) 26.0 - 34.0 pg   MCHC 31.4 30.0 - 36.0 g/dL   RDW 23.5 36.1 - 44.3 %   Platelets 281 150 - 400 K/uL   Neutrophils Relative % 83 (H) 43 - 77 %   Neutro Abs 10.3 (H) 1.7 - 7.7 K/uL   Lymphocytes Relative 12 12 - 46 %   Lymphs Abs 1.5 0.7 - 4.0 K/uL   Monocytes Relative 5 3 - 12 %   Monocytes Absolute 0.6 0.1 - 1.0 K/uL   Eosinophils Relative  0 0 - 5 %   Eosinophils Absolute 0.0 0.0 - 0.7 K/uL   Basophils Relative 0 0 - 1 %   Basophils Absolute 0.0 0.0 - 0.1 K/uL  Comprehensive metabolic panel     Status: Abnormal   Collection Time: 06/24/14  2:31 AM  Result Value Ref Range   Sodium 135 135 - 145 mmol/L   Potassium 3.7 3.5 - 5.1 mmol/L   Chloride 98 (L) 101 - 111 mmol/L   CO2 24 22 - 32 mmol/L   Glucose, Bld 159 (H) 65 - 99 mg/dL   BUN 8 6 - 20 mg/dL   Creatinine, Ser 5.78 0.44 - 1.00 mg/dL   Calcium 9.2 8.9 - 46.9 mg/dL   Total Protein 8.4 (H) 6.5 - 8.1 g/dL   Albumin 4.0 3.5 - 5.0 g/dL   AST 21 15 - 41 U/L   ALT 24 14 - 54 U/L   Alkaline Phosphatase 87 38 - 126 U/L   Total Bilirubin 0.5 0.3 - 1.2 mg/dL   GFR calc non Af Amer >60 >60  mL/min   GFR calc Af Amer >60 >60 mL/min   Anion gap 13 5 - 15  Lipase, blood     Status: Abnormal   Collection Time: 06/24/14  2:31 AM  Result Value Ref Range   Lipase 15 (L) 22 - 51 U/L  Pregnancy, urine     Status: None   Collection Time: 06/24/14  2:57 AM  Result Value Ref Range   Preg Test, Ur NEGATIVE NEGATIVE  Urinalysis, Routine w reflex microscopic (not at [facility])     Status: Abnormal   Collection Time: 06/24/14  2:57 AM  Result Value Ref Range   Color, Urine YELLOW YELLOW   APPearance CLOUDY (A) CLEAR   Specific Gravity, Urine 1.028 1.005 - 1.030   pH 7.0 5.0 - 8.0   Glucose, UA NEGATIVE NEGATIVE mg/dL   Hgb urine dipstick NEGATIVE NEGATIVE   Bilirubin Urine NEGATIVE NEGATIVE   Ketones, ur 40 (A) NEGATIVE mg/dL   Protein, ur 30 (A) NEGATIVE mg/dL   Urobilinogen, UA 0.2 0.0 - 1.0 mg/dL   Nitrite NEGATIVE NEGATIVE   Leukocytes, UA NEGATIVE NEGATIVE  Urine microscopic-add on     Status: Abnormal   Collection Time: 06/24/14  2:57 AM  Result Value Ref Range   Squamous Epithelial / LPF FEW (A) RARE   WBC, UA 0-2 <3 WBC/hpf   RBC / HPF 0-2 <3 RBC/hpf   Bacteria, UA FEW (A) RARE   Urine-Other MUCOUS PRESENT   Wet prep, genital     Status: None   Collection Time: 06/24/14  5:07 AM  Result Value Ref Range   Yeast Wet Prep HPF POC NONE SEEN NONE SEEN   Trich, Wet Prep NONE SEEN NONE SEEN   Clue Cells Wet Prep HPF POC NONE SEEN NONE SEEN   WBC, Wet Prep HPF POC NONE SEEN NONE SEEN      Assessment/Plan: Pelvic pain with bilateral ovarian masses.  Possible etiologies include infection, endometriosis, ovarian neoplasm.  Since she is not sexually active, infection is not likely.  Endometriosis most likely, ovarian neoplasm a possibility but she is young.  She is admitted for observation for pain control.  If possible will d/c home when pain is controlled and arrange for laparoscopy/possible laparotomy as an outpatient.  Tacari Repass D 06/24/2014, 5:12 PM

## 2014-06-24 NOTE — ED Provider Notes (Signed)
CSN: [id number]     Arrival date & time 06/23/14  2337 History  This chart was scribed for Devoria Albe, MD by Annye Asa, ED Scribe. This patient was seen in room A07C/A07C and the patient's care was started at 1:47 AM.    Chief Complaint  Patient presents with  . Flank Pain  . Abdominal Pain   The history is provided by the patient and medical records. No language interpreter was used.     HPI Comments: Charlene Jordan is a 29 y.o. female who presents to the Emergency Department complaining of constant, "sharp, squeezing" left flank pain beginning the morning of 06/23/14 around 05:00. Patient reports her pain is worsened with ambulation, movement, and when attempting to urinate, states "When I bear down to try to pee, nothing happens but it hurts." Her pain improves when standing and bent forward at a 45 degree angle. She reports associated abdominal pain (R > L), nausea, vomiting (5x) and chills throughout the day. Patient explains she had similar symptoms on 04/23/14 and was diagnosed with an ovarian cyst at that time. She was seen at [facility] on 06/23/14 in the morning and had an US done and was told her current symptoms were related to her cyst; she was discharged with motrin for pain which provided no relief. Patient denies fevers, dysuria, hematuria, diarrhea. She denies familial history of kidney stones.   LNMP 05/22/14-05/28/14.   PCP Dr. Cliffton Asters, OBG  Pamelia Hoit at [facility]. Patient saw her OB in May 2016 to determine the size of her cyst; she was placed on birth control at that time   . History reviewed. No pertinent past medical history. History reviewed. No pertinent past surgical history. No family history on file. History  Substance Use Topics  . Smoking status: Never Smoker   . Smokeless tobacco: Not on file  . Alcohol Use: No   She denies smoking; reports occasional EtOH use. She denies excessive milk or caffeine intake. She works as a Chief Technology Officer.       OB History    No data available     Review of Systems  Constitutional: Positive for chills.  Gastrointestinal: Positive for nausea, vomiting and abdominal pain. Negative for diarrhea.  Genitourinary: Positive for flank pain and difficulty urinating. Negative for dysuria and hematuria.  All other systems reviewed and are negative.  Allergies  Doxycycline  Home Medications   Prior to Admission medications   Medication Sig Start Date End Date Taking? Authorizing Provider  ibuprofen (ADVIL,MOTRIN) 600 MG tablet Take 1 tablet (600 mg total) by mouth every 8 (eight) hours as needed. 06/23/14  Yes Phineas Semen, MD   BP 135/55 mmHg  Pulse 77  Temp(Src) 98.1 F (36.7 C) (Oral)  Resp 16  SpO2 99%  LMP 05/22/2014  Vital signs normal   Physical Exam  Constitutional: She is oriented to person, place, and time. She appears well-developed and well-nourished.  Non-toxic appearance. She does not appear ill. No distress.  HENT:  Head: Normocephalic and atraumatic.  Right Ear: External ear normal.  Left Ear: External ear normal.  Nose: Nose normal. No mucosal edema or rhinorrhea.  Mouth/Throat: Oropharynx is clear and moist and mucous membranes are normal. No dental abscesses or uvula swelling.  Eyes: Conjunctivae and EOM are normal. Pupils are equal, round, and reactive to light.  Neck: Normal range of motion and full passive range of motion without pain. Neck supple.  Cardiovascular: Normal rate, regular rhythm and normal heart sounds.  Exam reveals no gallop and no friction rub.   No murmur heard. Pulmonary/Chest: Effort normal and breath sounds normal. No respiratory distress. She has no wheezes. She has no rhonchi. She has no rales. She exhibits no tenderness and no crepitus.  Abdominal: Soft. Normal appearance and bowel sounds are normal. She exhibits no distension. There is tenderness (Epigastric suprapubic, RUQ, RLQ, right flank). There is no rebound and no guarding.     Genitourinary:  Minimal white discharge, uterus is hard to palpate however she has pain to manipulation of her cervix. She is also tender over the left adnexa and the right adnexa and she states the right adnexal pain is the worse of the 3 areas.  Musculoskeletal: Normal range of motion. She exhibits no edema or tenderness.       Back:  Moves all extremities well.   Neurological: She is alert and oriented to person, place, and time. She has normal strength. No cranial nerve deficit.  Skin: Skin is warm, dry and intact. No rash noted. No erythema. No pallor.  Psychiatric: She has a normal mood and affect. Her speech is normal and behavior is normal. Her mood appears not anxious.  Nursing note and vitals reviewed.   ED Course  Procedures    Medications  0.9 %  sodium chloride infusion ( Intravenous Stopped 06/24/14 0548)  fentaNYL (SUBLIMAZE) injection 50 mcg (not administered)  ketorolac (TORADOL) 30 MG/ML injection 30 mg (30 mg Intravenous Given 06/24/14 0249)  ondansetron (ZOFRAN) injection 4 mg (4 mg Intravenous Given 06/24/14 0249)  iohexol (OMNIPAQUE) 300 MG/ML solution 25 mL (25 mLs Oral Contrast Given 06/24/14 0209)  iohexol (OMNIPAQUE) 300 MG/ML solution 100 mL (100 mLs Intravenous Contrast Given 06/24/14 0311)  cefTRIAXone (ROCEPHIN) 1 g in dextrose 5 % 50 mL IVPB (1 g Intravenous New Bag/Given 06/24/14 0548)    DIAGNOSTIC STUDIES: Oxygen Saturation is 99% on RA, normal by my interpretation.    COORDINATION OF CARE: 1:59 AM Discussed treatment plan with pt at bedside and pt agreed to plan.  Patient continues to complain of pain. After her pelvic exam she was given Rocephin 1 g IV. After reviewing her wet prep I discussed her with Dr. Huel Cote at 6:10 AM to 6:20 AM. He has reviewed her office notes from April 25. At that time she had a negative GC and Chlamydia. She had a ultrasound done the following day which was suspicious for a right hemorrhagic cyst. Started on  progesterone birth control pills. Patient has a history of polycystic ovaries. That time she was not sexually active. Due to the 2 ED visits in less than 24 hours and the abnormal reading on the CT scan she is going to have her admitted at [facility]. She wants her admitted to the third floor women's unit with herself as attending for now.  Pt is agreeable for transfer and admission   Labs Review Results for orders placed or performed during the hospital encounter of 06/24/14  Wet prep, genital  Result Value Ref Range   Yeast Wet Prep HPF POC NONE SEEN NONE SEEN   Trich, Wet Prep NONE SEEN NONE SEEN   Clue Cells Wet Prep HPF POC NONE SEEN NONE SEEN   WBC, Wet Prep HPF POC NONE SEEN NONE SEEN  CBC with Differential  Result Value Ref Range   WBC 12.3 (H) 4.0 - 10.5 K/uL   RBC 4.76 3.87 - 5.11 MIL/uL   Hemoglobin 11.7 (L) 12.0 - 15.0 g/dL   HCT  37.3 36.0 - 46.0 %   MCV 78.4 78.0 - 100.0 fL   MCH 24.6 (L) 26.0 - 34.0 pg   MCHC 31.4 30.0 - 36.0 g/dL   RDW 16.1 09.6 - 04.5 %   Platelets 281 150 - 400 K/uL   Neutrophils Relative % 83 (H) 43 - 77 %   Neutro Abs 10.3 (H) 1.7 - 7.7 K/uL   Lymphocytes Relative 12 12 - 46 %   Lymphs Abs 1.5 0.7 - 4.0 K/uL   Monocytes Relative 5 3 - 12 %   Monocytes Absolute 0.6 0.1 - 1.0 K/uL   Eosinophils Relative 0 0 - 5 %   Eosinophils Absolute 0.0 0.0 - 0.7 K/uL   Basophils Relative 0 0 - 1 %   Basophils Absolute 0.0 0.0 - 0.1 K/uL  Comprehensive metabolic panel  Result Value Ref Range   Sodium 135 135 - 145 mmol/L   Potassium 3.7 3.5 - 5.1 mmol/L   Chloride 98 (L) 101 - 111 mmol/L   CO2 24 22 - 32 mmol/L   Glucose, Bld 159 (H) 65 - 99 mg/dL   BUN 8 6 - 20 mg/dL   Creatinine, Ser 4.09 0.44 - 1.00 mg/dL   Calcium 9.2 8.9 - 81.1 mg/dL   Total Protein 8.4 (H) 6.5 - 8.1 g/dL   Albumin 4.0 3.5 - 5.0 g/dL   AST 21 15 - 41 U/L   ALT 24 14 - 54 U/L   Alkaline Phosphatase 87 38 - 126 U/L   Total Bilirubin 0.5 0.3 - 1.2 mg/dL   GFR calc non Af  Amer >60 >60 mL/min   GFR calc Af Amer >60 >60 mL/min   Anion gap 13 5 - 15  Lipase, blood  Result Value Ref Range   Lipase 15 (L) 22 - 51 U/L  Pregnancy, urine  Result Value Ref Range   Preg Test, Ur NEGATIVE NEGATIVE  Urinalysis, Routine w reflex microscopic (not at [place])  Result Value Ref Range   Color, Urine YELLOW YELLOW   APPearance CLOUDY (A) CLEAR   Specific Gravity, Urine 1.028 1.005 - 1.030   pH 7.0 5.0 - 8.0   Glucose, UA NEGATIVE NEGATIVE mg/dL   Hgb urine dipstick NEGATIVE NEGATIVE   Bilirubin Urine NEGATIVE NEGATIVE   Ketones, ur 40 (A) NEGATIVE mg/dL   Protein, ur 30 (A) NEGATIVE mg/dL   Urobilinogen, UA 0.2 0.0 - 1.0 mg/dL   Nitrite NEGATIVE NEGATIVE   Leukocytes, UA NEGATIVE NEGATIVE  Urine microscopic-add on  Result Value Ref Range   Squamous Epithelial / LPF FEW (A) RARE   WBC, UA 0-2 <3 WBC/hpf   RBC / HPF 0-2 <3 RBC/hpf   Bacteria, UA FEW (A) RARE   Urine-Other MUCOUS PRESENT    Laboratory interpretation all normal except for leukocytosis     Imaging Review  Ct Abdomen Pelvis W Contrast  06/24/2014   CLINICAL DATA:  Right flank pain radiating to the right lower quadrant.  EXAM: CT ABDOMEN AND PELVIS WITH CONTRAST  TECHNIQUE: Multidetector CT imaging of the abdomen and pelvis was performed using the standard protocol following bolus administration of intravenous contrast.  CONTRAST:  OMNIPAQUE IOHEXOL 300 MG/ML  SOLN  COMPARISON:  None.  FINDINGS: There are adnexal masses bilaterally, probably of ovarian origin. On the right, the mass measures 6.9 x 7.4 by 10.3 cm. On the left trauma the mass measures 4.1 x 4.6 by 4.9 cm. These masses are complex, with a suggestion of thick irregular walls.  There is a small volume free fluid in the pelvic cul-de-sac. There is high density fluid or abnormal soft tissue in the pericolic gutters bilaterally. The findings are concerning for possibility of ovarian malignancy with peritoneal spread of disease. Tubo-ovarian  abscesses are less likely but not excluded. Exophytic fibroids are unlikely.  The liver is normal in appearance. The spleen, pancreas, adrenals and kidneys appear normal. No adenopathy is evident in the abdomen or pelvis. The uterus appears unremarkable except for the adjacent adnexal masses.  There is no significant abnormality in the lower chest. There is no significant musculoskeletal abnormality.  IMPRESSION: Bilateral adnexal masses, suspicious for ovarian neoplasia. There is abnormal soft tissue or high density fluid in the pericolic gutters bilaterally, suspicious for peritoneal spread of disease.   Electronically Signed   By: Ellery Plunk M.D.   On: 06/24/2014 04:08     US Transvaginal Non-ob US Pelvis Complete US Art/ven Flow Abd Pelv Doppler  06/23/2014   CLINICAL DATA:  Right lower quadrant pain.    IMPRESSION: 1. 3.3 cm simple right ovarian cyst. This appears smaller than on prior CT of 04/22/2014. 2.7 cm simple cyst left ovary.  2.  Exam otherwise normal.  No evidence of ovarian torsion.   Electronically Signed   By: Maisie Fus  Register   On: 06/23/2014 10:34     EKG Interpretation None      MDM   Final diagnoses:  RLQ abdominal pain  Nausea and vomiting, vomiting of unspecified type   Transfer to [facility] for admission   Devoria Albe, MD, FACEP   I personally performed the services described in this documentation, which was scribed in my presence. The recorded information has been reviewed and considered.  Devoria Albe, MD, Concha Pyo, MD 06/24/14 [phone]

## 2014-06-24 NOTE — ED Notes (Addendum)
C/o R flank pain that radiates to RLQ/R pelvic area since Wednesday morning.  Also reports nausea, vomiting, and dysuria.  Seen at [facility] ED for same.  States she had labs and [place].  Taking Motrin without relief.

## 2014-06-25 MED ORDER — HYDROMORPHONE HCL 2 MG PO TABS: 2.0000 mg | ORAL_TABLET | Freq: Four times a day (QID) | ORAL | Status: DC | PRN

## 2014-06-25 MED ORDER — HYDROMORPHONE HCL 2 MG PO TABS
2.0000 mg | ORAL_TABLET | ORAL | Status: DC | PRN
  Administered 2014-06-25 (×2): 2 mg via ORAL
  Filled 2014-06-25 (×2): qty 1

## 2014-06-25 MED ORDER — IBUPROFEN 800 MG PO TABS: 800.0000 mg | ORAL_TABLET | Freq: Three times a day (TID) | ORAL | Status: DC | PRN

## 2014-06-25 NOTE — Progress Notes (Signed)
Patient ID: Charlene Jordan, female   DOB: [birth year], 29 y.o.   MRN: [id number]  Pt's pain controlled with PO meds, ready for discharge.  Desires to f/u with Dr. Jackelyn Knife.  Will d/c with Motrin and oral Dilaudid.  F/U 2-4 weeks

## 2014-06-25 NOTE — Progress Notes (Signed)
Patient ID: Charlene Jordan, female   DOB: 09-19-1985, 29 y.o.   MRN: [id number] HD#2 Says she feels a little better today, pain more in the middle, voiding ok Afeb, VSS Abd- soft, still tender but no rebound Will change to PO meds, ambulate, reg diet, see if can d/c home later today

## 2014-06-25 NOTE — Discharge Summary (Signed)
Physician Discharge Summary  Patient ID: Charlene Jordan MRN: [id number] DOB/AGE: Jan 24, 1985 29 y.o.  Admit date: 06/24/2014 Discharge date: 06/25/2014  Admission Diagnoses: ovarian cysts ? Simple vs complex, pain  Discharge Diagnoses:  Active Problems:   RLQ abdominal pain   Pelvic pain in female   Discharged Condition: stable  Hospital Course: admitted after CT and [place] for pain control with IV pain meds.  After IV meds overnight, pain controlled with PO meds.  Agrees with d/c to home.    Consults: None  Significant Diagnostic Studies: radiology: CT scan: 6/9 and Ultrasound: 6/8  Treatments: IV hydration and analgesia: Dilaudid  Discharge Exam: Blood pressure 119/81, pulse 82, temperature 98.4 F (36.9 C), temperature source Axillary, resp. rate 18, height 5' 7.5" (1.715 m), weight 142.883 kg (315 lb), last menstrual period 05/22/2014, SpO2 100 %. General appearance: alert and no distress, sleepy  Disposition: 01-Home or Self Care  Discharge Instructions    Call MD for:  persistant nausea and vomiting    Complete by:  As directed      Call MD for:  severe uncontrolled pain    Complete by:  As directed      Diet - low sodium heart healthy    Complete by:  As directed      Discharge instructions    Complete by:  As directed   Call [phone] with questions or problems  Also use heating pad to help with pelvic pain     Driving Restrictions    Complete by:  As directed   While taking strong pain medicine     Increase activity slowly    Complete by:  As directed      May shower / Bathe    Complete by:  As directed      May walk up steps    Complete by:  As directed             Medication List    TAKE these medications        HYDROmorphone 2 MG tablet  Commonly known as:  DILAUDID  Take 1 tablet (2 mg total) by mouth every 6 (six) hours as needed for severe pain.     ibuprofen 800 MG tablet  Commonly known as:  ADVIL,MOTRIN  Take 1 tablet (800 mg total) by mouth  every 8 (eight) hours as needed for moderate pain.           Follow-up Information    Follow up with MEISINGER,TODD D, MD. Schedule an appointment as soon as possible for a visit in 3 weeks.   Specialty:  Obstetrics and Gynecology   Why:  follow up in 2-4 weeks   Contact information:   [address] [place] [address] [phone]       Signed: Sherian Rein 06/25/2014, 5:30 PM

## 2014-06-25 NOTE — Progress Notes (Signed)
Patient discharged home with mother... Discharge instructions reviewed with patient and she verbalized understanding... Condition stable... No equipment... Ambulated to car with Daphane Shepherd, NT.

## 2014-07-01 ENCOUNTER — Encounter (HOSPITAL_COMMUNITY): Payer: Self-pay | Admitting: Emergency Medicine

## 2015-07-11 ENCOUNTER — Emergency Department (HOSPITAL_COMMUNITY)
Admission: EM | Admit: 2015-07-11 | Discharge: 2015-07-12 | Disposition: A | Discharge status: Home / Self Care | Payer: BLUE CROSS/BLUE SHIELD | Attending: Emergency Medicine | Admitting: Emergency Medicine

## 2015-07-11 ENCOUNTER — Encounter (HOSPITAL_COMMUNITY): Payer: Self-pay | Admitting: Emergency Medicine

## 2015-07-11 DIAGNOSIS — E119 Type 2 diabetes mellitus without complications: Secondary | ICD-10-CM | POA: Diagnosis not present

## 2015-07-11 DIAGNOSIS — R110 Nausea: Secondary | ICD-10-CM | POA: Diagnosis not present

## 2015-07-11 DIAGNOSIS — R1031 Right lower quadrant pain: Secondary | ICD-10-CM | POA: Insufficient documentation

## 2015-07-11 DIAGNOSIS — I10 Essential (primary) hypertension: Secondary | ICD-10-CM | POA: Insufficient documentation

## 2015-07-11 DIAGNOSIS — R102 Pelvic and perineal pain: Secondary | ICD-10-CM

## 2015-07-11 DIAGNOSIS — R197 Diarrhea, unspecified: Secondary | ICD-10-CM | POA: Diagnosis not present

## 2015-07-11 DIAGNOSIS — Z79899 Other long term (current) drug therapy: Secondary | ICD-10-CM | POA: Insufficient documentation

## 2015-07-11 HISTORY — DX: Unspecified ovarian cyst, unspecified side: N83.209

## 2015-07-11 LAB — URINALYSIS, ROUTINE W REFLEX MICROSCOPIC
Bilirubin Urine: NEGATIVE
GLUCOSE, UA: NEGATIVE mg/dL
Ketones, ur: NEGATIVE mg/dL
Leukocytes, UA: NEGATIVE
Nitrite: NEGATIVE
PH: 7 (ref 5.0–8.0)
Protein, ur: NEGATIVE mg/dL
Specific Gravity, Urine: 1.017 (ref 1.005–1.030)

## 2015-07-11 LAB — COMPREHENSIVE METABOLIC PANEL
ALBUMIN: 3.5 g/dL (ref 3.5–5.0)
ALT: 29 U/L (ref 14–54)
ANION GAP: 8 (ref 5–15)
AST: 26 U/L (ref 15–41)
Alkaline Phosphatase: 111 U/L (ref 38–126)
BUN: 9 mg/dL (ref 6–20)
CHLORIDE: 105 mmol/L (ref 101–111)
CO2: 24 mmol/L (ref 22–32)
Calcium: 9.2 mg/dL (ref 8.9–10.3)
Creatinine, Ser: 0.87 mg/dL (ref 0.44–1.00)
GFR calc Af Amer: >60 mL/min (ref >60)
GFR calc non Af Amer: >60 mL/min (ref >60)
GLUCOSE: 153 mg/dL — AB (ref 65–99)
Potassium: 3.9 mmol/L (ref 3.5–5.1)
SODIUM: 137 mmol/L (ref 135–145)
Total Bilirubin: 0.4 mg/dL (ref 0.3–1.2)
Total Protein: 7.4 g/dL (ref 6.5–8.1)

## 2015-07-11 LAB — LIPASE, BLOOD: LIPASE: 16 U/L (ref 11–51)

## 2015-07-11 LAB — URINE MICROSCOPIC-ADD ON

## 2015-07-11 LAB — CBC
HEMATOCRIT: 38.6 % (ref 36.0–46.0)
HEMOGLOBIN: 12.2 g/dL (ref 12.0–15.0)
MCH: 24.7 pg — AB (ref 26.0–34.0)
MCHC: 31.6 g/dL (ref 30.0–36.0)
MCV: 78.1 fL (ref 78.0–100.0)
Platelets: 276 10*3/uL (ref 150–400)
RBC: 4.94 MIL/uL (ref 3.87–5.11)
RDW: 14.2 % (ref 11.5–15.5)
WBC: 9.5 10*3/uL (ref 4.0–10.5)

## 2015-07-11 NOTE — ED Notes (Signed)
Pt presents to ED for RLQ pain which started this morning.  Pt sts she also developed diarrhea yesterday, and has intermittent nausea x 2 days.

## 2015-07-12 ENCOUNTER — Emergency Department (HOSPITAL_COMMUNITY): Payer: BLUE CROSS/BLUE SHIELD

## 2015-07-12 LAB — WET PREP, GENITAL
SPERM: NONE SEEN
Trich, Wet Prep: NONE SEEN
YEAST WET PREP: NONE SEEN

## 2015-07-12 LAB — GC/CHLAMYDIA PROBE AMP (~~LOC~~) NOT AT ARMC
Chlamydia: NEGATIVE
Neisseria Gonorrhea: NEGATIVE

## 2015-07-12 LAB — PREGNANCY, URINE: Preg Test, Ur: NEGATIVE

## 2015-07-12 MED ORDER — NAPROXEN 500 MG PO TABS: 500.0000 mg | ORAL_TABLET | Freq: Two times a day (BID) | ORAL | Status: DC

## 2015-07-12 MED ORDER — IBUPROFEN 200 MG PO TABS
600.0000 mg | ORAL_TABLET | Freq: Once | ORAL | Status: AC
  Administered 2015-07-12: 600 mg via ORAL
  Filled 2015-07-12: qty 3

## 2015-07-12 NOTE — ED Provider Notes (Signed)
H/o ovarian cysts Now with right pelvic pain Has loose stools and headache  Pending US to evaluate for torsion vs cysts  Plan: if neg refer to GYN\  2:00 - went to introduce myself and do re-evaluation of the patient. Still in ultrasound.  3:15 - patient's ultrasound negative for torsion. She has a left ovarian cyst which does not correlate to tenderness on exam. Patient is well appearing. Stable for discharge home per plan of previous treatment team.   Elpidio AnisShari Yaritzi Craun, PA-C 07/18/15 [phone]  Azalia BilisKevin Campos, MD 07/20/15 1725

## 2015-07-12 NOTE — Discharge Instructions (Signed)

## 2015-07-12 NOTE — ED Provider Notes (Signed)
CSN: [id number]     Arrival date & time 07/11/15  1708 History   First MD Initiated Contact with Patient 07/11/15 2354     Chief Complaint  Patient presents with  . Abdominal Pain  . Diarrhea  . Nausea     (Consider location/radiation/quality/duration/timing/severity/associated sxs/prior Treatment) HPI   Patient is a 30 year old female with a history of obesity, ovarian cyst, DM, HTN who presents the ED with right lower quadrant pain that began today. Patient is a history of ovarian cyst and she states the pain is the same. Pain comes and goes, waxes and wanes between achy and sharp, nonradiating, 6/10. She started her menstrual period 3 days ago. Associated loose stools for 2 days and nausea. Patient complains of a frontal throbbing headache that is constant, nonradiating since this morning. Patient denies chest pain, shortness of breath, dysuria, hematuria, blood in her stool, vaginal discharge, no hx of STD's.  Past Medical History  Diagnosis Date  . Obesity   . Ovarian cyst    History reviewed. No pertinent past surgical history. History reviewed. No pertinent family history. Social History  Substance Use Topics  . Smoking status: Never Smoker   . Smokeless tobacco: None  . Alcohol Use: Yes     Comment: socially   OB History    Gravida Para Term Preterm AB TAB SAB Ectopic Multiple Living   0 0 0 0 0 0 0 0       Review of Systems  Constitutional: Negative for fever and chills.  Respiratory: Negative for shortness of breath.   Cardiovascular: Negative for chest pain.  Gastrointestinal: Positive for nausea, abdominal pain and diarrhea. Negative for vomiting and blood in stool.  Genitourinary: Negative for dysuria, hematuria and vaginal discharge.  Musculoskeletal: Negative for myalgias and back pain.  Skin: Negative for rash.  Neurological: Positive for headaches. Negative for dizziness, syncope, weakness and numbness.      Allergies  Doxycycline  Home Medications    Prior to Admission medications   Medication Sig Start Date End Date Taking? Authorizing Provider  acetaminophen (TYLENOL) 325 MG tablet Take 650 mg by mouth every 6 (six) hours as needed for mild pain.   Yes Historical Provider, MD  Chlorphen-Phenyleph-ASA (ALKA-SELTZER PLUS COLD PO) Take 1 tablet by mouth every 6 (six) hours as needed (cold symptoms).   Yes Historical Provider, MD  chlorpheniramine-HYDROcodone (TUSSIONEX PENNKINETIC ER) 10-8 MG/5ML LQCR Take 5 mLs by mouth every 12 (twelve) hours as needed for cough. 12/14/12  Yes Quita SkyeMichael Ghim, MD  HYDROmorphone (DILAUDID) 2 MG tablet Take 1 tablet (2 mg total) by mouth every 6 (six) hours as needed for severe pain. 06/25/14  Yes Jody Bovard-Stuckert, MD  ibuprofen (ADVIL,MOTRIN) 800 MG tablet Take 1 tablet (800 mg total) by mouth every 8 (eight) hours as needed for moderate pain. 06/25/14  Yes Jody Bovard-Stuckert, MD   BP 155/74 mmHg  Pulse 64  Temp(Src) 98.5 F (36.9 C) (Oral)  Resp 18  Ht 5\' 7"  (1.702 m)  Wt 138.347 kg  BMI 47.76 kg/m2  SpO2 97%  LMP 07/11/2015 Physical Exam  Constitutional: She appears well-developed and well-nourished. No distress.  HENT:  Head: Normocephalic and atraumatic.  Eyes: Conjunctivae are normal.  Cardiovascular: Normal rate, regular rhythm and normal heart sounds.  Exam reveals no gallop and no friction rub.   No murmur heard. Pulses:      Dorsalis pedis pulses are 2+ on the right side, and 2+ on the left side.  Pulmonary/Chest: Effort normal  and breath sounds normal. No respiratory distress. She has no wheezes. She has no rales.  Abdominal: Soft. Bowel sounds are normal. She exhibits no distension. There is no tenderness. There is no rebound and no guarding.  Genitourinary:  Exam performed by Jerre SimonJessica L Han Vejar,  exam chaperoned Date: 07/12/2015 Pelvic exam: normal external genitalia without evidence of trauma. VULVA: normal appearing vulva with no masses, tenderness or lesion. VAGINA: normal  appearing vagina with normal color and discharge, no lesions. CERVIX: normal appearing cervix without lesions, cervical motion tenderness absent, cervical os closed with out purulent discharge; vaginal discharge - bloody, Wet prep and DNA probe for chlamydia and GC obtained.   ADNEXA: normal adnexa in size, tender on the right, nontender on the left and no masses UTERUS: uterus is normal size, shape, consistency and mildly tender.   Musculoskeletal: Normal range of motion.  Neurological: She is alert. Coordination normal.  Skin: Skin is warm and dry. No rash noted. She is not diaphoretic.  Psychiatric: She has a normal mood and affect. Her behavior is normal.  Nursing note and vitals reviewed.   ED Course  Procedures (including critical care time) Labs Review Labs Reviewed  COMPREHENSIVE METABOLIC PANEL - Abnormal; Notable for the following:    Glucose, Bld 153 (*)    All other components within normal limits  CBC - Abnormal; Notable for the following:    MCH 24.7 (*)    All other components within normal limits  URINALYSIS, ROUTINE W REFLEX MICROSCOPIC (NOT [facility]) - Abnormal; Notable for the following:    Hgb urine dipstick MODERATE (*)    All other components within normal limits  URINE MICROSCOPIC-ADD ON - Abnormal; Notable for the following:    Squamous Epithelial / LPF 0-5 (*)    Bacteria, UA RARE (*)    All other components within normal limits  WET PREP, GENITAL  LIPASE, BLOOD  PREGNANCY, URINE  GC/CHLAMYDIA PROBE AMP ([place]) NOT [facility]    Imaging Review No results found. I have personally reviewed and evaluated these images and lab results as part of my medical decision-making.   EKG Interpretation None      MDM   Final diagnoses:  None    Patient is nontoxic, nonseptic appearing, in no apparent distress. Pt with pelvic pain similar to roughly 1 year ago when pt diagnosed with ovarian cysts. Pt nontender on abdominal exam, does not have a surgical  abdomin and there are no peritoneal signs less concerning for appendicitis, small bowel obstruction, bowel perforation, cholecystitis, or diverticulitis. Pt pain controlled in the ED.  Pt tender in the right adnxea on pelvic exam. Will order an pelvic US to rule out ovarian torsion.   UA with blood but likely 2/2 menstrual period. Will culture urine but will not treat at this time as pt is asymptomatic.   US and wet prep pending. Other labs unremarkable.   If negative patient will be discharged home with symptomatic treatment and given strict instructions for follow-up with their OB/GYN and primary care physician.    Pt care [facility] out [facility], PA-C at change of shift.        Jerre SimonJessica L Roizy Harold, PA 07/12/15 0124  Azalia BilisKevin Campos, MD 07/12/15 [phone]

## 2015-07-13 LAB — URINE CULTURE

## 2016-04-06 IMAGING — CT CT ABD-PELV W/ CM
2 of 4 series · 16 of 46 positions shown, 18 images · IV contrast (Omni 300)
Comparison: None.

CLINICAL DATA: Right flank pain radiating to the right lower
quadrant.

EXAM:
CT ABDOMEN AND PELVIS WITH CONTRAST
TECHNIQUE: Multidetector CT imaging of the abdomen and pelvis was performed
using the standard protocol following bolus administration of
intravenous contrast.
CONTRAST:  100mL OMNIPAQUE IOHEXOL 300 MG/ML  SOLN

[Series 2: abd/ pelvis 5.0 i30f 1 · axial · 0.84mm/px · z∈[-543,-58]mm · 13 of 107 slices shown, 15 images]
[im 5/107  soft-tissue]
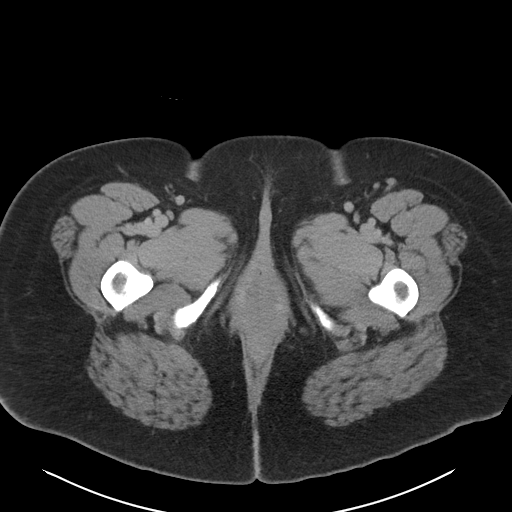
[im 5/107  bone]
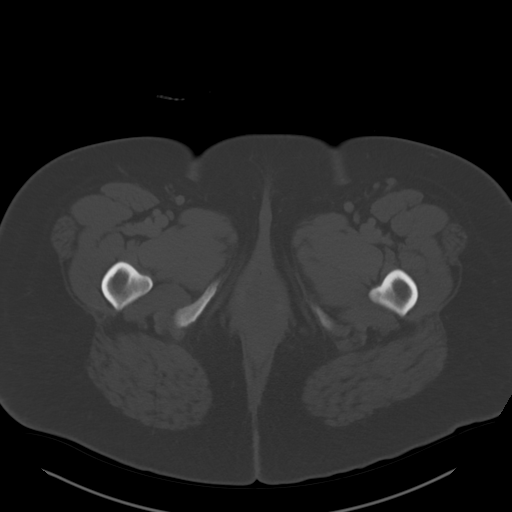
[im 13/107  soft-tissue]
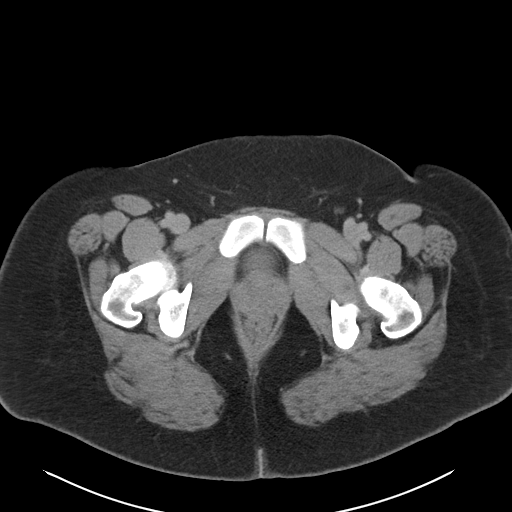
[im 22/107  soft-tissue]
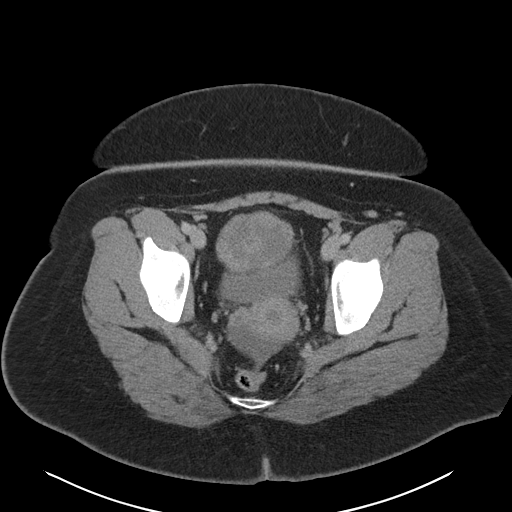
[im 30/107  soft-tissue]
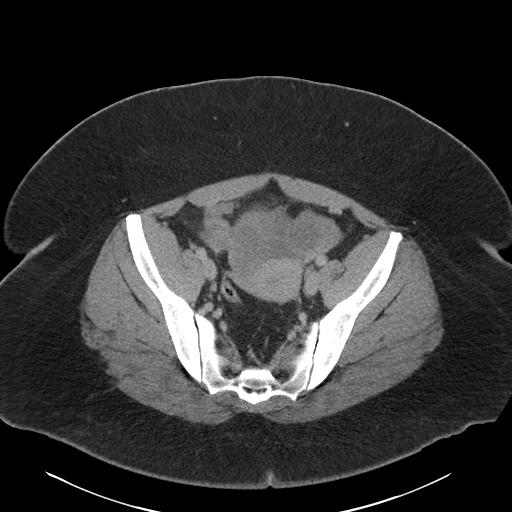
[im 39/107  soft-tissue]
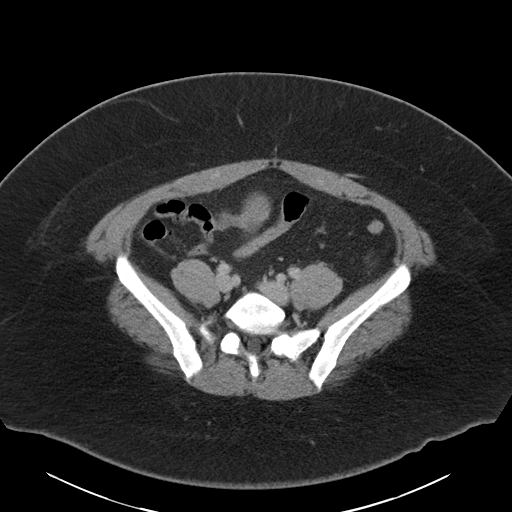
[im 47/107  soft-tissue]
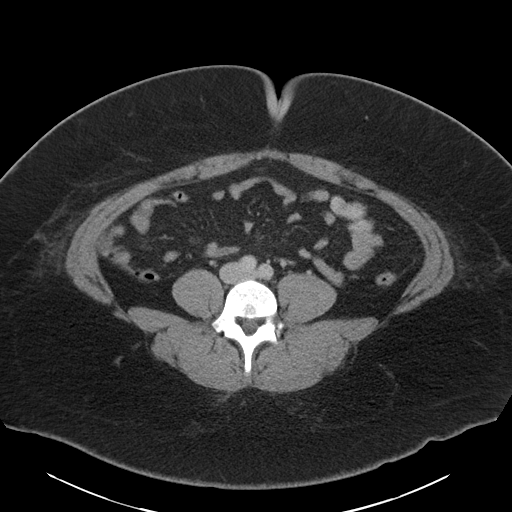
[im 56/107  soft-tissue]
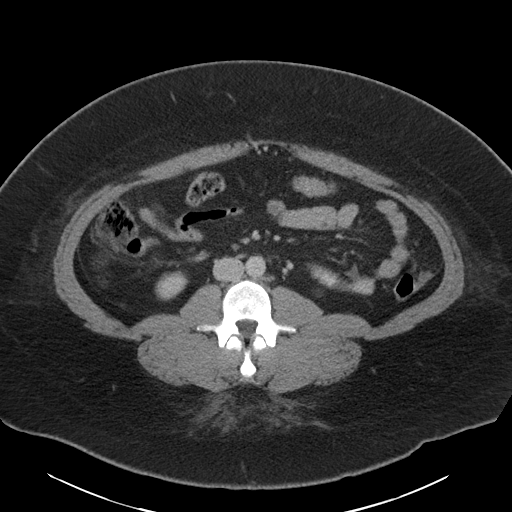
[im 60/107  soft-tissue]
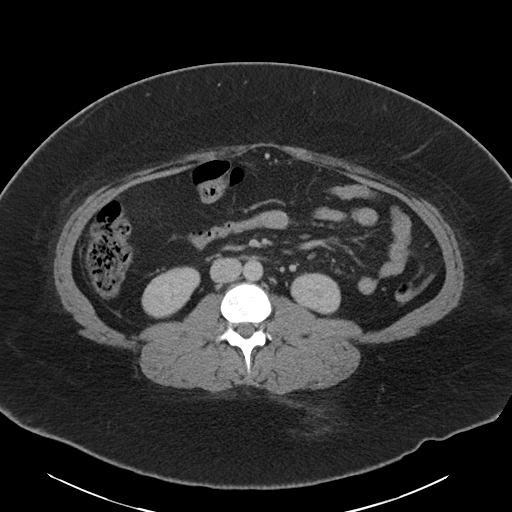
[im 68/107  soft-tissue]
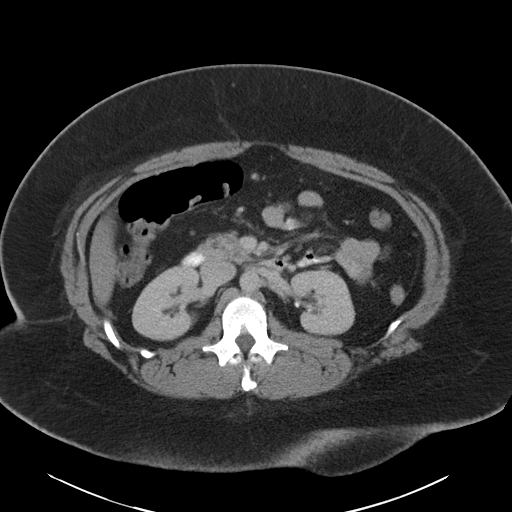
[im 68/107  bone]
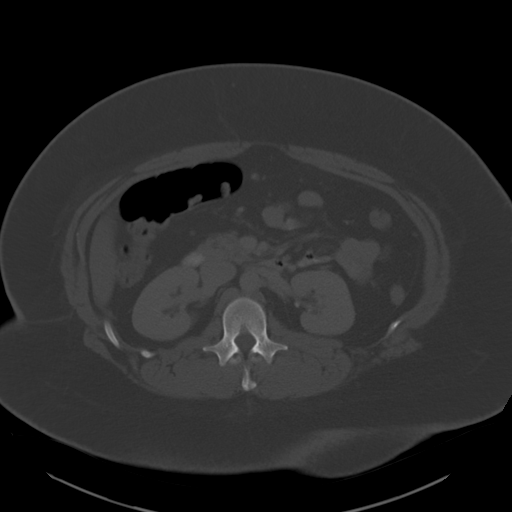
[im 77/107  soft-tissue]
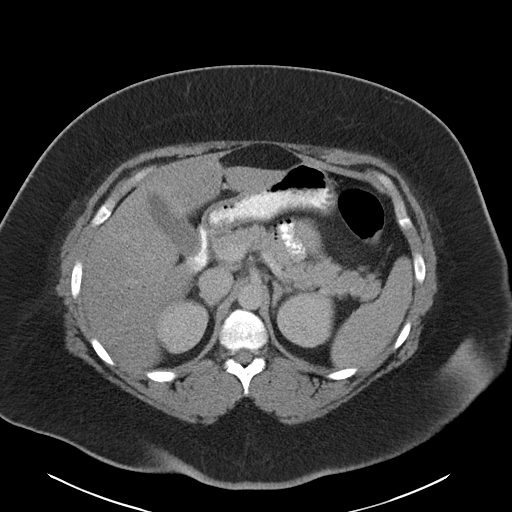
[im 85/107  soft-tissue]
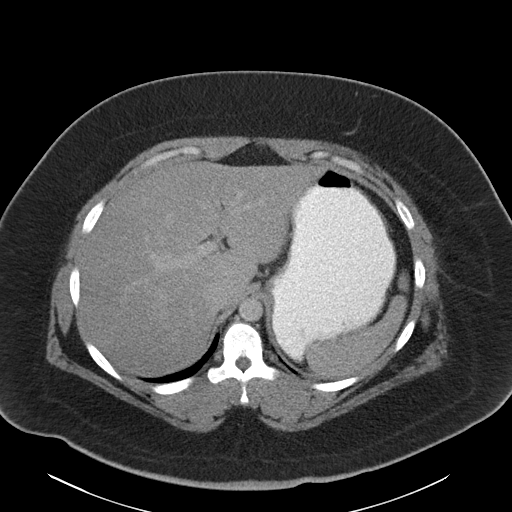
[im 94/107  soft-tissue]
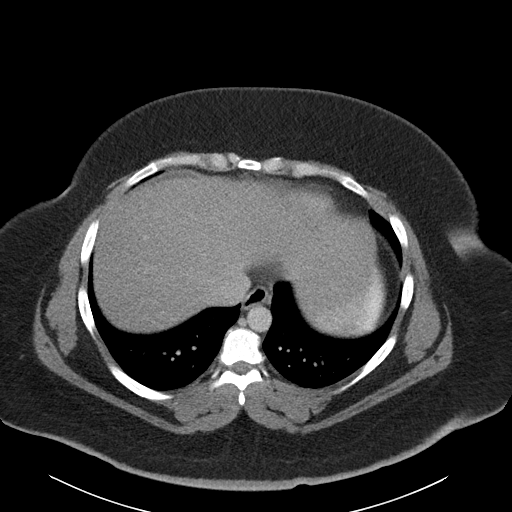
[im 102/107  soft-tissue]
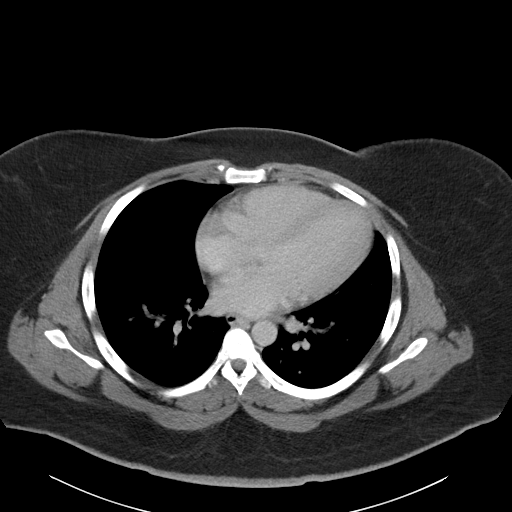

[Series 5: coronals · coronal · 0.81mm/px · 3 of 136 slices shown]
[im 46/136  soft-tissue]
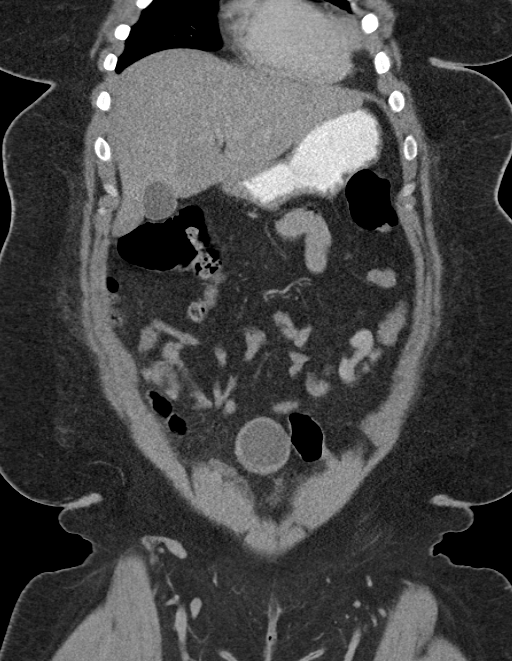
[im 61/136  soft-tissue]
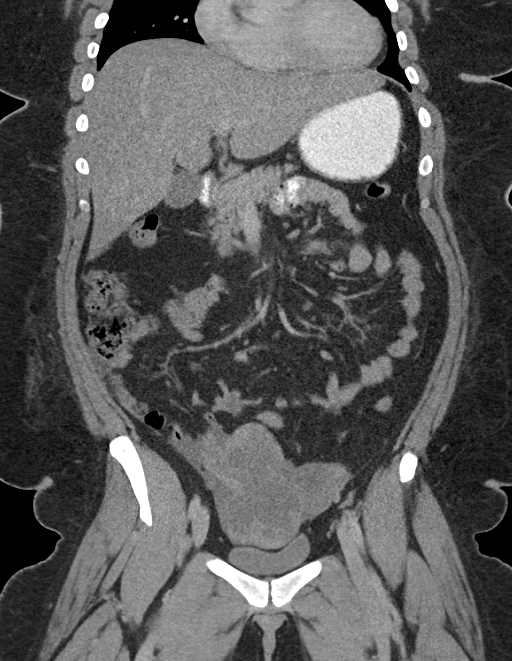
[im 76/136  soft-tissue]
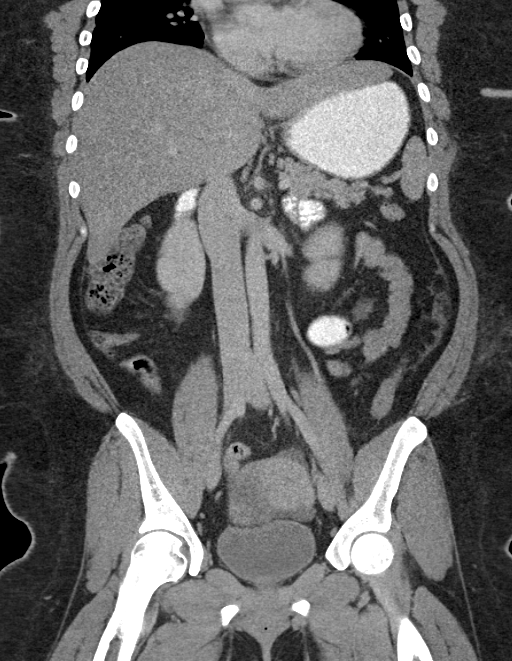

[16 of 46 positions shown; findings below may reference images not displayed]

FINDINGS: There are adnexal masses bilaterally, probably of ovarian origin. On
the right, the mass measures 6.9 x 7.4 by 10.3 cm. On the left
trauma the mass measures 4.1 x 4.6 by 4.9 cm. These masses are
complex, with a suggestion of thick irregular walls. There is a
small volume free fluid in the pelvic cul-de-sac. There is high
density fluid or abnormal soft tissue in the pericolic gutters
bilaterally. The findings are concerning for possibility of ovarian
malignancy with peritoneal spread of disease. Tubo-ovarian abscesses
are less likely but not excluded. Exophytic fibroids are unlikely.

The liver is normal in appearance. The spleen, pancreas, adrenals
and kidneys appear normal. No adenopathy is evident in the abdomen
or pelvis. The uterus appears unremarkable except for the adjacent
adnexal masses.

There is no significant abnormality in the lower chest. There is no
significant musculoskeletal abnormality.
IMPRESSION: Bilateral adnexal masses, suspicious for ovarian neoplasia. There is
abnormal soft tissue or high density fluid in the pericolic gutters
bilaterally, suspicious for peritoneal spread of disease.

## 2016-05-25 ENCOUNTER — Other Ambulatory Visit (HOSPITAL_COMMUNITY)
Admission: RE | Admit: 2016-05-25 | Discharge: 2016-05-25 | Disposition: A | Discharge status: Home / Self Care | Payer: BLUE CROSS/BLUE SHIELD | Source: Ambulatory Visit | Attending: Family Medicine | Admitting: Family Medicine

## 2016-05-25 ENCOUNTER — Other Ambulatory Visit: Payer: Self-pay | Admitting: Family Medicine

## 2016-05-25 DIAGNOSIS — Z01419 Encounter for gynecological examination (general) (routine) without abnormal findings: Secondary | ICD-10-CM | POA: Diagnosis not present

## 2016-05-28 LAB — CYTOLOGY - PAP: Diagnosis: NEGATIVE

## 2017-04-17 ENCOUNTER — Other Ambulatory Visit: Payer: Self-pay

## 2017-04-17 ENCOUNTER — Encounter (HOSPITAL_BASED_OUTPATIENT_CLINIC_OR_DEPARTMENT_OTHER): Payer: Self-pay

## 2017-04-17 ENCOUNTER — Emergency Department (HOSPITAL_BASED_OUTPATIENT_CLINIC_OR_DEPARTMENT_OTHER)
Admission: EM | Admit: 2017-04-17 | Discharge: 2017-04-18 | Disposition: A | Discharge status: Home / Self Care | Payer: 59 | Attending: Emergency Medicine | Admitting: Emergency Medicine

## 2017-04-17 DIAGNOSIS — R197 Diarrhea, unspecified: Secondary | ICD-10-CM | POA: Diagnosis not present

## 2017-04-17 DIAGNOSIS — R112 Nausea with vomiting, unspecified: Secondary | ICD-10-CM | POA: Diagnosis present

## 2017-04-17 DIAGNOSIS — Z79899 Other long term (current) drug therapy: Secondary | ICD-10-CM | POA: Insufficient documentation

## 2017-04-17 DIAGNOSIS — E119 Type 2 diabetes mellitus without complications: Secondary | ICD-10-CM | POA: Diagnosis not present

## 2017-04-17 HISTORY — DX: Type 2 diabetes mellitus without complications: E11.9

## 2017-04-17 LAB — COMPREHENSIVE METABOLIC PANEL
ALBUMIN: 3.6 g/dL (ref 3.5–5.0)
ALT: 34 U/L (ref 14–54)
ANION GAP: 10 (ref 5–15)
AST: 26 U/L (ref 15–41)
Alkaline Phosphatase: 112 U/L (ref 38–126)
BILIRUBIN TOTAL: 0.4 mg/dL (ref 0.3–1.2)
BUN: 10 mg/dL (ref 6–20)
CO2: 22 mmol/L (ref 22–32)
Calcium: 7.8 mg/dL — ABNORMAL LOW (ref 8.9–10.3)
Chloride: 102 mmol/L (ref 101–111)
Creatinine, Ser: 0.67 mg/dL (ref 0.44–1.00)
GFR calc Af Amer: >60 mL/min (ref >60)
GFR calc non Af Amer: >60 mL/min (ref >60)
GLUCOSE: 186 mg/dL — AB (ref 65–99)
Potassium: 3.8 mmol/L (ref 3.5–5.1)
Sodium: 134 mmol/L — ABNORMAL LOW (ref 135–145)
Total Protein: 7.7 g/dL (ref 6.5–8.1)

## 2017-04-17 LAB — URINALYSIS, ROUTINE W REFLEX MICROSCOPIC
Bilirubin Urine: NEGATIVE
Glucose, UA: NEGATIVE mg/dL
HGB URINE DIPSTICK: NEGATIVE
Ketones, ur: 15 mg/dL — AB
Leukocytes, UA: NEGATIVE
Nitrite: NEGATIVE
Protein, ur: 30 mg/dL — AB
Specific Gravity, Urine: 1.01 (ref 1.005–1.030)
pH: >9 — ABNORMAL HIGH (ref 5.0–8.0)

## 2017-04-17 LAB — CBC
HCT: 37.8 % (ref 36.0–46.0)
Hemoglobin: 12.1 g/dL (ref 12.0–15.0)
MCH: 25 pg — ABNORMAL LOW (ref 26.0–34.0)
MCHC: 32 g/dL (ref 30.0–36.0)
MCV: 78.1 fL (ref 78.0–100.0)
Platelets: 250 10*3/uL (ref 150–400)
RBC: 4.84 MIL/uL (ref 3.87–5.11)
RDW: 14.3 % (ref 11.5–15.5)
WBC: 7.5 10*3/uL (ref 4.0–10.5)

## 2017-04-17 LAB — URINALYSIS, MICROSCOPIC (REFLEX): RBC / HPF: NONE SEEN RBC/hpf (ref 0–5)

## 2017-04-17 LAB — LIPASE, BLOOD: Lipase: 22 U/L (ref 11–51)

## 2017-04-17 LAB — PREGNANCY, URINE: PREG TEST UR: NEGATIVE

## 2017-04-17 MED ORDER — ONDANSETRON HCL 4 MG/2ML IJ SOLN
4.0000 mg | Freq: Once | INTRAMUSCULAR | Status: AC
  Administered 2017-04-17: 4 mg via INTRAVENOUS
  Filled 2017-04-17: qty 2

## 2017-04-17 MED ORDER — SODIUM CHLORIDE 0.9 % IV BOLUS
1000.0000 mL | Freq: Once | INTRAVENOUS | Status: AC
  Administered 2017-04-17: 1000 mL via INTRAVENOUS

## 2017-04-17 MED ORDER — LOPERAMIDE HCL 2 MG PO CAPS
4.0000 mg | ORAL_CAPSULE | Freq: Once | ORAL | Status: AC
  Administered 2017-04-17: 4 mg via ORAL
  Filled 2017-04-17: qty 2

## 2017-04-17 NOTE — ED Triage Notes (Signed)
Pt c/o vomiting, diarrhea and upper abdominal pain with fever for the last three days

## 2017-04-17 NOTE — ED Provider Notes (Signed)
[facility] EMERGENCY DEPARTMENT Provider Note   CSN: [id number] Arrival date & time: 04/17/17  2140     History   Chief Complaint Chief Complaint  Patient presents with  . Emesis    HPI Charlene Jordan is a 32 y.o. female with a h/o of obesity and DM Type II who presents with nausea, vomiting, diarrhea, abdominal pain, chills, and subjective fever.  The patient endorses NBNB emesis x8 that began 3 days ago followed by diffuse, constant abdominal pain, chills, subjective fever, and multiple episodes of nonbloody diarrhea.  She reports that her symptoms of continued over the last 2 days.  She felt that she was starting to improve yesterday and was able to eat tacos without vomiting, but had one episode of vomiting this morning as well as multiple episodes of diarrhea throughout the day.  No known aggravating or alleviating factors.  She denies back pain, dysuria, hematuria, vaginal pain or discharge, chest pain, or dyspnea.   Her son has been sick with similar symptoms over the last week and only started to have his symptoms resolve over the last 2-3 days. No recent travel.  She has treated her symptoms with Sprite, ginger ale, Alka-Seltzer with minimal improvement.  She was able to take her dose of Trulicity yesterday morning, but has not taken her metformin since symptoms began.    The history is provided by the patient.  No language interpreter was used.   Emesis    Associated symptoms include abdominal pain, chills, diarrhea and a fever. Pertinent negatives include no headaches.    Past Medical History:  Diagnosis Date  . Diabetes mellitus without complication (HCC)   . Obesity   . Ovarian cyst     Patient Active Problem List   Diagnosis Date Noted  . RLQ abdominal pain 06/24/2014  . Pelvic pain in female 06/24/2014    History reviewed. No pertinent surgical history.   OB History    Gravida  0   Para  0   Term  0   Preterm  0   AB  0   Living       SAB  0   TAB  0   Ectopic  0   Multiple      Live Births               Home Medications    Prior to Admission medications   Medication Sig Start Date End Date Taking? Authorizing Provider  acetaminophen (TYLENOL) 325 MG tablet Take 650 mg by mouth every 6 (six) hours as needed for mild pain.    [provider]  Chlorphen-Phenyleph-ASA (ALKA-SELTZER PLUS COLD PO) Take 1 tablet by mouth every 6 (six) hours as needed (cold symptoms).    [provider]  chlorpheniramine-HYDROcodone (TUSSIONEX PENNKINETIC ER) 10-8 MG/5ML LQCR Take 5 mLs by mouth every 12 (twelve) hours as needed for cough. 12/14/12   Quita Skye, MD  HYDROmorphone (DILAUDID) 2 MG tablet Take 1 tablet (2 mg total) by mouth every 6 (six) hours as needed for severe pain. 06/25/14   Bovard-Stuckert, Augusto Gamble, MD  ibuprofen (ADVIL,MOTRIN) 800 MG tablet Take 1 tablet (800 mg total) by mouth every 8 (eight) hours as needed for moderate pain. 06/25/14   Bovard-Stuckert, Augusto Gamble, MD  loperamide (IMODIUM) 2 MG capsule Take 1 capsule (2 mg total) by mouth 4 (four) times daily as needed for diarrhea or loose stools. 04/18/17   Rodgers Likes A, PA-C  naproxen (NAPROSYN) 500 MG tablet Take  1 tablet (500 mg total) by mouth 2 (two) times daily. 07/12/15   Elpidio AnisUpstill, Shari, PA-C  ondansetron (ZOFRAN ODT) 4 MG disintegrating tablet Take 1 tablet (4 mg total) by mouth every 8 (eight) hours as needed for nausea or vomiting. 04/18/17   Sidonia Nutter A, PA-C    Family History No family history on file.  Social History Social History   Tobacco Use  . Smoking status: Never Smoker  Substance Use Topics  . Alcohol use: Yes    Comment: socially  . Drug use: No     Allergies   Doxycycline   Review of Systems Review of Systems  Constitutional: Positive for chills and fever. Negative for activity change.  Respiratory: Negative for shortness of breath.   Cardiovascular: Negative for chest pain.  Gastrointestinal:  Positive for abdominal pain, diarrhea, nausea and vomiting. Negative for blood in stool and constipation.  Genitourinary: Negative for dysuria, frequency, hematuria and vaginal pain.  Musculoskeletal: Negative for back pain.  Skin: Negative for rash.  Allergic/Immunologic: Negative for immunocompromised state.  Neurological: Negative for dizziness, weakness, light-headedness and headaches.  Psychiatric/Behavioral: Negative for confusion.     Physical Exam Updated Vital Signs BP 129/78 (BP Location: Left Arm)   Pulse 90   Temp 98.9 F (37.2 C) (Oral)   Resp 18   Ht 5\' 7"  (1.702 m)   Wt 136.1 kg (300 lb)   LMP 03/25/2017   SpO2 100%   BMI 46.99 kg/m    Physical Exam  Constitutional: No distress.  HENT:  Head: Normocephalic.  Eyes: Conjunctivae are normal. No scleral icterus.  Neck: Neck supple.  Cardiovascular: Normal rate, regular rhythm, normal heart sounds and intact distal pulses. Exam reveals no gallop and no friction rub.  No murmur heard. Pulmonary/Chest: Effort normal. No stridor. No respiratory distress. She has no wheezes. She has no rales. She exhibits no tenderness.  Abdominal: Soft. She exhibits no distension and no mass. There is tenderness. There is no rebound and no guarding. No hernia.  Obese abdomen. Hyperactive bowel sounds in all 4 quadrants.  Diffusely tender to palpation throughout the abdomen without focal tenderness.  No CVA tenderness bilaterally.  No peritoneal signs.  Musculoskeletal: She exhibits no tenderness.  Neurological: She is alert.  Skin: Skin is warm. Capillary refill takes less than 2 seconds. No rash noted. She is not diaphoretic.  Psychiatric: Her behavior is normal.  Nursing note and vitals reviewed.  ED Treatments / Results  Labs (all labs ordered are listed, but only abnormal results are displayed) Labs Reviewed  URINALYSIS, ROUTINE W REFLEX MICROSCOPIC - Abnormal; Notable for the following components:      Result Value   pH >9.0  (*)    Ketones, ur 15 (*)    Protein, ur 30 (*)    All other components within normal limits  CBC - Abnormal; Notable for the following components:   MCH 25.0 (*)    All other components within normal limits  COMPREHENSIVE METABOLIC PANEL - Abnormal; Notable for the following components:   Sodium 134 (*)    Glucose, Bld 186 (*)    Calcium 7.8 (*)    All other components within normal limits  URINALYSIS, MICROSCOPIC (REFLEX) - Abnormal; Notable for the following components:   Bacteria, UA FEW (*)    Squamous Epithelial / LPF 6-30 (*)    All other components within normal limits  PREGNANCY, URINE  LIPASE, BLOOD    EKG None  Radiology No results found.  Procedures  Procedures (including critical care time)  Medications Ordered in ED Medications  sodium chloride 0.9 % bolus 1,000 mL (1,000 mLs Intravenous New Bag/Given 04/17/17 2321)  ondansetron (ZOFRAN) injection 4 mg (4 mg Intravenous Given 04/17/17 2321)  loperamide (IMODIUM) capsule 4 mg (4 mg Oral Given 04/17/17 2321)     Initial Impression / Assessment and Plan / ED Course  I have reviewed the triage vital signs and the nursing notes.  Pertinent labs & imaging results that were available during my care of the patient were reviewed by me and considered in my medical decision making (see chart for details).     32 year old female with a history of obesity and type 2 diabetes mellitus presenting with nausea, vomiting, diarrhea, subjective fever, chills, abdominal pain x3 days.  Her son was ill with similar symptoms over the last week.  On exam, she has hyperactive bowel sounds and is diffusely tender throughout the abdomen.  No focal tenderness.  No peritoneal signs.  Labs with no leukocytosis.  Urinalysis is not concerning for infection.  Hyperglycemia of 186 with anion gap of 10. Suspect this is a viral illness, given recent sick contacts.  Doubt diabetic ketoacidosis, appendicitis, cholecystitis, ovarian torsion,  diverticulitis, or colitis.  In the ED, nausea has resolved.  She was successfully fluid challenged.  Will discharge the patient home with symptomatic treatment.  Strict return precautions given.  She is hemodynamically stable and in no acute distress. Safe for d/c at this time.    Final Clinical Impressions(s) / ED Diagnoses   Final diagnoses:  Nausea vomiting and diarrhea    ED Discharge Orders        Ordered    loperamide (IMODIUM) 2 MG capsule  4 times daily PRN     04/18/17 0016    ondansetron (ZOFRAN ODT) 4 MG disintegrating tablet  Every 8 hours PRN     04/18/17 0016      Deantre Bourdon A, PA-C 04/18/17 0021  Melene Plan, DO 04/18/17 1603

## 2017-04-18 MED ORDER — LOPERAMIDE HCL 2 MG PO CAPS: 2.0000 mg | ORAL_CAPSULE | Freq: Four times a day (QID) | ORAL | 0 refills | Status: DC | PRN

## 2017-04-18 MED ORDER — ONDANSETRON 4 MG PO TBDP: 4.0000 mg | ORAL_TABLET | Freq: Three times a day (TID) | ORAL | 0 refills | Status: DC | PRN

## 2017-04-18 NOTE — Discharge Instructions (Signed)
Let 1 tablet of Zofran dissolve under your tongue every 8 hours as needed for nausea or vomiting.  Take 1 capsule of loperamide every 6 hours as needed for diarrhea or loose stools.  Continue to drink plenty of fluids to avoid getting dehydrated.  Start to retake your medication for diabetes as soon as you are not vomiting.  This is most likely a viral infection and should resolve in the next few days.  If you develop any new or worsening symptoms including vomiting despite taking Zofran, blood in your vomit or diarrhea, severe uncontrollable abdominal pain, or fever that does not improve with Tylenol, you should return to the emergency department for re-evaluation.

## 2017-04-18 NOTE — ED Notes (Signed)
Sprite given for PO challenge. 

## 2018-06-19 ENCOUNTER — Other Ambulatory Visit: Payer: Self-pay

## 2018-06-19 ENCOUNTER — Emergency Department (HOSPITAL_COMMUNITY)
Admission: EM | Admit: 2018-06-19 | Discharge: 2018-06-19 | Disposition: A | Discharge status: Home / Self Care | Payer: 59 | Attending: Emergency Medicine | Admitting: Emergency Medicine

## 2018-06-19 ENCOUNTER — Encounter (HOSPITAL_COMMUNITY): Payer: Self-pay | Admitting: *Deleted

## 2018-06-19 ENCOUNTER — Emergency Department (HOSPITAL_BASED_OUTPATIENT_CLINIC_OR_DEPARTMENT_OTHER)
Admit: 2018-06-19 | Discharge: 2018-06-19 | Disposition: A | Discharge status: Home / Self Care | Payer: 59 | Attending: Emergency Medicine | Admitting: Emergency Medicine

## 2018-06-19 DIAGNOSIS — Z79899 Other long term (current) drug therapy: Secondary | ICD-10-CM | POA: Diagnosis not present

## 2018-06-19 DIAGNOSIS — Z7984 Long term (current) use of oral hypoglycemic drugs: Secondary | ICD-10-CM | POA: Insufficient documentation

## 2018-06-19 DIAGNOSIS — Z6841 Body Mass Index (BMI) 40.0 and over, adult: Secondary | ICD-10-CM | POA: Insufficient documentation

## 2018-06-19 DIAGNOSIS — E669 Obesity, unspecified: Secondary | ICD-10-CM | POA: Insufficient documentation

## 2018-06-19 DIAGNOSIS — L03116 Cellulitis of left lower limb: Secondary | ICD-10-CM | POA: Insufficient documentation

## 2018-06-19 DIAGNOSIS — E119 Type 2 diabetes mellitus without complications: Secondary | ICD-10-CM | POA: Insufficient documentation

## 2018-06-19 DIAGNOSIS — M79609 Pain in unspecified limb: Secondary | ICD-10-CM

## 2018-06-19 DIAGNOSIS — R600 Localized edema: Secondary | ICD-10-CM | POA: Diagnosis not present

## 2018-06-19 DIAGNOSIS — M79605 Pain in left leg: Secondary | ICD-10-CM | POA: Diagnosis present

## 2018-06-19 LAB — CBC WITH DIFFERENTIAL/PLATELET
Abs Immature Granulocytes: 0.02 10*3/uL (ref 0.00–0.07)
Basophils Absolute: 0 10*3/uL (ref 0.0–0.1)
Basophils Relative: 0 %
Eosinophils Absolute: 0.3 10*3/uL (ref 0.0–0.5)
Eosinophils Relative: 3 %
HCT: 39.3 % (ref 36.0–46.0)
Hemoglobin: 11.9 g/dL — ABNORMAL LOW (ref 12.0–15.0)
Immature Granulocytes: 0 %
Lymphocytes Relative: 27 %
Lymphs Abs: 2.5 10*3/uL (ref 0.7–4.0)
MCH: 23.8 pg — ABNORMAL LOW (ref 26.0–34.0)
MCHC: 30.3 g/dL (ref 30.0–36.0)
MCV: 78.6 fL — ABNORMAL LOW (ref 80.0–100.0)
Monocytes Absolute: 0.8 10*3/uL (ref 0.1–1.0)
Monocytes Relative: 8 %
Neutro Abs: 5.7 10*3/uL (ref 1.7–7.7)
Neutrophils Relative %: 62 %
Platelets: 269 10*3/uL (ref 150–400)
RBC: 5 MIL/uL (ref 3.87–5.11)
RDW: 14.6 % (ref 11.5–15.5)
WBC: 9.3 10*3/uL (ref 4.0–10.5)
nRBC: 0 % (ref 0.0–0.2)

## 2018-06-19 LAB — COMPREHENSIVE METABOLIC PANEL
ALT: 39 U/L (ref 0–44)
AST: 25 U/L (ref 15–41)
Albumin: 3.4 g/dL — ABNORMAL LOW (ref 3.5–5.0)
Alkaline Phosphatase: 116 U/L (ref 38–126)
Anion gap: 12 (ref 5–15)
BUN: 11 mg/dL (ref 6–20)
CO2: 26 mmol/L (ref 22–32)
Calcium: 9 mg/dL (ref 8.9–10.3)
Chloride: 99 mmol/L (ref 98–111)
Creatinine, Ser: 1.08 mg/dL — ABNORMAL HIGH (ref 0.44–1.00)
GFR calc Af Amer: >60 mL/min (ref >60)
GFR calc non Af Amer: >60 mL/min (ref >60)
Glucose, Bld: 137 mg/dL — ABNORMAL HIGH (ref 70–99)
Potassium: 3.8 mmol/L (ref 3.5–5.1)
Sodium: 137 mmol/L (ref 135–145)
Total Bilirubin: 0.6 mg/dL (ref 0.3–1.2)
Total Protein: 7.7 g/dL (ref 6.5–8.1)

## 2018-06-19 LAB — I-STAT BETA HCG BLOOD, ED (MC, WL, AP ONLY): I-stat hCG, quantitative: <5 m[IU]/mL (ref <5)

## 2018-06-19 MED ORDER — CEPHALEXIN 500 MG PO CAPS: 500.0000 mg | ORAL_CAPSULE | Freq: Four times a day (QID) | ORAL | 0 refills | Status: DC

## 2018-06-19 MED ORDER — SULFAMETHOXAZOLE-TRIMETHOPRIM 800-160 MG PO TABS: 1.0000 | ORAL_TABLET | Freq: Two times a day (BID) | ORAL | 0 refills | Status: AC

## 2018-06-19 MED ORDER — CEPHALEXIN 250 MG PO CAPS
500.0000 mg | ORAL_CAPSULE | Freq: Once | ORAL | Status: AC
  Administered 2018-06-19: 500 mg via ORAL
  Filled 2018-06-19: qty 2

## 2018-06-19 MED ORDER — SULFAMETHOXAZOLE-TRIMETHOPRIM 800-160 MG PO TABS
1.0000 | ORAL_TABLET | Freq: Once | ORAL | Status: AC
  Administered 2018-06-19: 1 via ORAL
  Filled 2018-06-19: qty 1

## 2018-06-19 NOTE — Discharge Instructions (Signed)
Take Bactrim and Keflex as prescribed until completed.  Follow-up with your doctor as planned tomorrow.  Keep your leg elevated whenever you are not walking on it.  Please return to the emergency department if you develop any new or worsening symptoms including spread of the redness and swelling, persistent fever 100.4, worsening pain, or any other new or concerning symptoms.

## 2018-06-19 NOTE — ED Triage Notes (Addendum)
Pt reports LLE pain which started in her L groin area, pain travelled down to her knee and behind her knee.  Today, she noticed her L foot is swollen.  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  She denies any sob or cp

## 2018-06-19 NOTE — ED Provider Notes (Signed)
[facility] EMERGENCY DEPARTMENT Provider Note   CSN: [id number] Arrival date & time: 06/19/18  1710    History   Chief Complaint Chief Complaint  Patient presents with   Leg Pain    HPI Charlene Jordan is a 33 y.o. female with history of diabetes, obesity who presents with a one-week history of left lower extremity pain and swelling.  She reports the pain started in her left anterior groin and pain traveled down to her knee and now she has pain and swelling in her left foot.  She has associated redness.  She denies any fevers, chest pain, shortness of breath.  She denies any recent long trips, surgeries, known cancer, exogenous estrogen use.  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  Patient has been taking ibuprofen and Tylenol with some relief.     HPI  Past Medical History:  Diagnosis Date   Diabetes mellitus without complication (HCC)    Obesity    Ovarian cyst     Patient Active Problem List   Diagnosis Date Noted   RLQ abdominal pain 06/24/2014   Pelvic pain in female 06/24/2014    History reviewed. No pertinent surgical history.   OB History    Gravida  0   Para  0   Term  0   Preterm  0   AB  0   Living        SAB  0   TAB  0   Ectopic  0   Multiple      Live Births               Home Medications    Prior to Admission medications   Medication Sig Start Date End Date Taking? Authorizing Provider  amphetamine-dextroamphetamine (ADDERALL XR) 20 MG 24 hr capsule Take 20 mg by mouth 2 (two) times daily as needed (ADHD).   Yes [provider]  Dulaglutide (TRULICITY) 1.5 MG/0.5ML SOPN Inject 0.5 mLs into the skin once a week.    Yes [provider]  empagliflozin (JARDIANCE) 25 MG TABS tablet Take 25 mg by mouth daily.   Yes [provider]  escitalopram (LEXAPRO) 20 MG tablet Take 20 mg by mouth daily.   Yes [provider]  glimepiride (AMARYL) 2 MG tablet Take 2 mg  by mouth daily.   Yes [provider]  ibuprofen (ADVIL,MOTRIN) 800 MG tablet Take 1 tablet (800 mg total) by mouth every 8 (eight) hours as needed for moderate pain. 06/25/14  Yes Bovard-Stuckert, Jody, MD  metFORMIN (GLUCOPHAGE-XR) 500 MG 24 hr tablet Take 1,000 mg by mouth daily.   Yes [provider]  rosuvastatin (CRESTOR) 40 MG tablet Take 40 mg by mouth at bedtime.   Yes [provider]  valsartan (DIOVAN) 320 MG tablet Take 320 mg by mouth daily.   Yes [provider]  cephALEXin (KEFLEX) 500 MG capsule Take 1 capsule (500 mg total) by mouth 4 (four) times daily. 06/19/18   Harland Aguiniga, Waylan Boga, PA-C  sulfamethoxazole-trimethoprim (BACTRIM DS) 800-160 MG tablet Take 1 tablet by mouth 2 (two) times daily for 7 days. 06/19/18 06/26/18  Emi Holes, PA-C    Family History No family history on file.  Social History Social History   Tobacco Use   Smoking status: Never Smoker   Smokeless tobacco: Never Used  Substance Use Topics   Alcohol use: Yes    Comment: socially   Drug use: No  Allergies   Doxycycline   Review of Systems Review of Systems  Constitutional: Negative for chills and fever.  HENT: Negative for facial swelling and sore throat.   Respiratory: Negative for shortness of breath.   Cardiovascular: Positive for leg swelling. Negative for chest pain.  Gastrointestinal: Negative for abdominal pain, nausea and vomiting.  Genitourinary: Negative for dysuria.  Musculoskeletal: Negative for back pain.  Skin: Positive for color change. Negative for rash and wound.  Neurological: Negative for headaches.  Psychiatric/Behavioral: The patient is not nervous/anxious.      Physical Exam Updated Vital Signs BP 137/90    Pulse (!) 105    Temp 98.8 F (37.1 C) (Oral)    Resp 16    Ht  (1.702 m)    Wt 127 kg    LMP 06/12/2018    SpO2 97%    BMI 43.85 kg/m   Physical Exam Vitals signs and nursing note reviewed.  Constitutional:       General: She is not in acute distress.    Appearance: She is well-developed. She is not diaphoretic.  HENT:     Head: Normocephalic and atraumatic.     Mouth/Throat:     Pharynx: No oropharyngeal exudate.  Eyes:     General: No scleral icterus.       Right eye: No discharge.        Left eye: No discharge.     Conjunctiva/sclera: Conjunctivae normal.     Pupils: Pupils are equal, round, and reactive to light.  Neck:     Musculoskeletal: Normal range of motion and neck supple.     Thyroid: No thyromegaly.  Cardiovascular:     Rate and Rhythm: Normal rate and regular rhythm.     Heart sounds: Normal heart sounds. No murmur. No friction rub. No gallop.   Pulmonary:     Effort: Pulmonary effort is normal. No respiratory distress.     Breath sounds: Normal breath sounds. No stridor. No wheezing or rales.  Abdominal:     General: Bowel sounds are normal. There is no distension.     Palpations: Abdomen is soft.     Tenderness: There is no abdominal tenderness. There is no guarding or rebound.  Musculoskeletal:     Comments: 2+ pitting edema with erythema noted to the left foot; there is no significant tenderness to the leg above this area on the leg; there is mild tenderness to the anterior left groin without skin change or palpable mass  Lymphadenopathy:     Cervical: No cervical adenopathy.  Skin:    General: Skin is warm and dry.     Coloration: Skin is not pale.     Findings: No rash.  Neurological:     Mental Status: She is alert.     Coordination: Coordination normal.      ED Treatments / Results  Labs (all labs ordered are listed, but only abnormal results are displayed) Labs Reviewed  CBC WITH DIFFERENTIAL/PLATELET - Abnormal; Notable for the following components:      Result Value   Hemoglobin 11.9 (*)    MCV 78.6 (*)    MCH 23.8 (*)    All other components within normal limits  COMPREHENSIVE METABOLIC PANEL - Abnormal; Notable for the following components:    Glucose, Bld 137 (*)    Creatinine, Ser 1.08 (*)    Albumin 3.4 (*)    All other components within normal limits  I-STAT BETA HCG BLOOD, ED ([facility], [facility], [facility]  ONLY)    EKG None  Radiology Vas [place] Lower Extremity Venous (dvt) (only [facility] 7a-7p)  Result Date: 06/19/2018  Lower Venous Study Indications: Pain.  Limitations: Body habitus and poor ultrasound/tissue interface. Performing Technologist: Chanda Busing RVT  Examination Guidelines: A complete evaluation includes B-mode imaging, spectral Doppler, color Doppler, and power Doppler as needed of all accessible portions of each vessel. Bilateral testing is considered an integral part of a complete examination. Limited examinations for reoccurring indications may be performed as noted.  +-----+---------------+---------+-----------+----------+-------+  RIGHT Compressibility Phasicity Spontaneity Properties Summary  +-----+---------------+---------+-----------+----------+-------+  CFV   Full            Yes       Yes                             +-----+---------------+---------+-----------+----------+-------+   +---------+---------------+---------+-----------+----------+--------------+  LEFT      Compressibility Phasicity Spontaneity Properties Summary         +---------+---------------+---------+-----------+----------+--------------+  CFV       Full            Yes       Yes                                    +---------+---------------+---------+-----------+----------+--------------+  SFJ       Full                                                             +---------+---------------+---------+-----------+----------+--------------+  FV Prox   Full                                                             +---------+---------------+---------+-----------+----------+--------------+  FV Mid    Full                                                             +---------+---------------+---------+-----------+----------+--------------+  FV Distal Full                                                              +---------+---------------+---------+-----------+----------+--------------+  PFV       Full                                                             +---------+---------------+---------+-----------+----------+--------------+  POP       Full  Yes       Yes                                    +---------+---------------+---------+-----------+----------+--------------+  PTV       Full                                                             +---------+---------------+---------+-----------+----------+--------------+  PERO                                                       Not visualized  +---------+---------------+---------+-----------+----------+--------------+     Summary: Right: No evidence of common femoral vein obstruction. Left: There is no evidence of deep vein thrombosis in the lower extremity. However, portions of this examination were limited- see technologist comments above. No cystic structure found in the popliteal fossa.  *See table(s) above for measurements and observations.    Preliminary     Procedures Procedures (including critical care time)  Medications Ordered in ED Medications  sulfamethoxazole-trimethoprim (BACTRIM DS) 800-160 MG per tablet 1 tablet (1 tablet Oral Given 06/19/18 2037)  cephALEXin (KEFLEX) capsule 500 mg (500 mg Oral Given 06/19/18 2037)     Initial Impression / Assessment and Plan / ED Course  I have reviewed the triage vital signs and the nursing notes.  Pertinent labs & imaging results that were available during my care of the patient were reviewed by me and considered in my medical decision making (see chart for details).        Patient presenting with suspected probable cellulitis to the LLE. DVT study is negative. Labs are unremarkable for the patient. Will treat with Bactrim and Keflex. Patient already has an appointment with PCP tomorrow. Recheck DVT study in 1 month if still swelling. Return  precautions discussed. Patient understands and agrees with plan.  Patient stable throughout ED course and discharged in satisfactory condition. I discussed patient case with Dr. Silverio LayYao who guided the patient's management and agrees with plan.   Final Clinical Impressions(s) / ED Diagnoses   Final diagnoses:  Cellulitis of left lower extremity    ED Discharge Orders         Ordered    cephALEXin (KEFLEX) 500 MG capsule  4 times daily     06/19/18 2030    sulfamethoxazole-trimethoprim (BACTRIM DS) 800-160 MG tablet  2 times daily     06/19/18 2030           Emi HolesLaw, Shariah Assad M, PA-C 06/19/18 2046    Charlynne PanderYao, David Hsienta, MD 06/26/18 [phone]

## 2018-06-19 NOTE — Progress Notes (Signed)
Left lower extremity venous duplex has been completed. Preliminary results can be found in CV Proc through chart review.  Results were given to Buel Ream PA.   06/19/18 7:01 PM Olen Cordial RVT

## 2019-01-08 ENCOUNTER — Ambulatory Visit: Payer: 59 | Attending: Internal Medicine

## 2019-01-08 DIAGNOSIS — Z20822 Contact with and (suspected) exposure to covid-19: Secondary | ICD-10-CM

## 2019-01-09 LAB — NOVEL CORONAVIRUS, NAA: SARS-CoV-2, NAA: NOT DETECTED

## 2019-03-19 ENCOUNTER — Other Ambulatory Visit (HOSPITAL_COMMUNITY)
Admission: RE | Admit: 2019-03-19 | Discharge: 2019-03-19 | Disposition: A | Discharge status: Home / Self Care | Payer: 59 | Source: Ambulatory Visit | Attending: Family Medicine | Admitting: Family Medicine

## 2019-03-19 DIAGNOSIS — Z124 Encounter for screening for malignant neoplasm of cervix: Secondary | ICD-10-CM | POA: Diagnosis not present

## 2019-03-24 LAB — CYTOLOGY - PAP
Comment: NEGATIVE
Diagnosis: NEGATIVE
Diagnosis: REACTIVE
High risk HPV: NEGATIVE

## 2019-05-02 ENCOUNTER — Ambulatory Visit: Payer: 59

## 2020-08-02 ENCOUNTER — Encounter (HOSPITAL_COMMUNITY): Payer: Self-pay

## 2020-08-02 ENCOUNTER — Emergency Department (HOSPITAL_COMMUNITY)
Admission: EM | Admit: 2020-08-02 | Discharge: 2020-08-02 | Disposition: A | Discharge status: Home / Self Care | Payer: No Typology Code available for payment source | Attending: Emergency Medicine | Admitting: Emergency Medicine

## 2020-08-02 ENCOUNTER — Other Ambulatory Visit: Payer: Self-pay

## 2020-08-02 DIAGNOSIS — Y92410 Unspecified street and highway as the place of occurrence of the external cause: Secondary | ICD-10-CM | POA: Diagnosis not present

## 2020-08-02 DIAGNOSIS — S199XXA Unspecified injury of neck, initial encounter: Secondary | ICD-10-CM | POA: Diagnosis present

## 2020-08-02 DIAGNOSIS — E119 Type 2 diabetes mellitus without complications: Secondary | ICD-10-CM | POA: Insufficient documentation

## 2020-08-02 DIAGNOSIS — Z7984 Long term (current) use of oral hypoglycemic drugs: Secondary | ICD-10-CM | POA: Diagnosis not present

## 2020-08-02 DIAGNOSIS — S1081XA Abrasion of other specified part of neck, initial encounter: Secondary | ICD-10-CM | POA: Insufficient documentation

## 2020-08-02 DIAGNOSIS — S51812A Laceration without foreign body of left forearm, initial encounter: Secondary | ICD-10-CM | POA: Diagnosis not present

## 2020-08-02 DIAGNOSIS — M7918 Myalgia, other site: Secondary | ICD-10-CM

## 2020-08-02 MED ORDER — CYCLOBENZAPRINE HCL 10 MG PO TABS: 10.0000 mg | ORAL_TABLET | Freq: Two times a day (BID) | ORAL | 0 refills | Status: DC | PRN

## 2020-08-02 MED ORDER — IBUPROFEN 600 MG PO TABS: 600.0000 mg | ORAL_TABLET | Freq: Four times a day (QID) | ORAL | 0 refills | Status: AC | PRN

## 2020-08-02 NOTE — Discharge Instructions (Addendum)
Cool compresses to the sore areas. Use ibuprofen and Flexeril as prescribed.
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
## 2021-02-06 DIAGNOSIS — R7401 Elevation of levels of liver transaminase levels: Secondary | ICD-10-CM | POA: Diagnosis not present

## 2022-03-09 ENCOUNTER — Ambulatory Visit (HOSPITAL_COMMUNITY)
Admission: EM | Admit: 2022-03-09 | Discharge: 2022-03-09 | Disposition: A | Discharge status: Home / Self Care | Payer: Commercial Managed Care - HMO | Attending: Physician Assistant | Admitting: Physician Assistant

## 2022-03-09 ENCOUNTER — Encounter (HOSPITAL_COMMUNITY): Payer: Self-pay

## 2022-03-09 ENCOUNTER — Ambulatory Visit (INDEPENDENT_AMBULATORY_CARE_PROVIDER_SITE_OTHER): Payer: Commercial Managed Care - HMO

## 2022-03-09 DIAGNOSIS — J189 Pneumonia, unspecified organism: Secondary | ICD-10-CM

## 2022-03-09 DIAGNOSIS — R059 Cough, unspecified: Secondary | ICD-10-CM

## 2022-03-09 DIAGNOSIS — E1165 Type 2 diabetes mellitus with hyperglycemia: Secondary | ICD-10-CM

## 2022-03-09 DIAGNOSIS — R531 Weakness: Secondary | ICD-10-CM

## 2022-03-09 HISTORY — DX: Essential (primary) hypertension: I10

## 2022-03-09 HISTORY — DX: Pure hypercholesterolemia, unspecified: E78.00

## 2022-03-09 LAB — POCT URINALYSIS DIPSTICK, ED / UC
Bilirubin Urine: NEGATIVE
Glucose, UA: >=1000 mg/dL — AB
Ketones, ur: 15 mg/dL — AB
Leukocytes,Ua: NEGATIVE
Nitrite: NEGATIVE
Protein, ur: 100 mg/dL — AB
Specific Gravity, Urine: 1.02 (ref 1.005–1.030)
Urobilinogen, UA: 0.2 mg/dL (ref 0.0–1.0)
pH: 5.5 (ref 5.0–8.0)

## 2022-03-09 LAB — CBG MONITORING, ED: Glucose-Capillary: 363 mg/dL — ABNORMAL HIGH (ref 70–99)

## 2022-03-09 MED ORDER — AMOXICILLIN-POT CLAVULANATE 875-125 MG PO TABS: 1.0000 | ORAL_TABLET | Freq: Two times a day (BID) | ORAL | 0 refills | Status: AC

## 2022-03-09 MED ORDER — AZITHROMYCIN 250 MG PO TABS: 250.0000 mg | ORAL_TABLET | Freq: Every day | ORAL | 0 refills | Status: AC

## 2022-03-09 NOTE — Discharge Instructions (Signed)
Your x-ray showed that you have pneumonia developing in your left lung.  Start Augmentin twice daily for 7 days.  Take azithromycin as prescribed.  I recommend you take Mucinex and over-the-counter medications such as Tylenol/ibuprofen for additional symptom relief.  Your blood sugar is very elevated.  Please avoid carbohydrates and drink lots of fluid.  It is very important that you are reevaluated first thing next week.  Please follow-up with either our clinic or your primary care.  If you have any worsening or changing symptoms including worsening cough, shortness of breath, nausea/vomiting, weakness, confusion, chest pain you need to go to the ER.

## 2022-03-09 NOTE — ED Triage Notes (Signed)
Patient reports a productive cough with brown sputum and weakness x 1 week.  Patient reports that she has been taking Alka Seltzer cough and cold, Mucinex, and sudafed. Patient states she last took Mucinex at 0900 today.

## 2022-03-09 NOTE — ED Provider Notes (Signed)
[place]    CSN: [id number] Arrival date & time: 03/09/22  1138      History   Chief Complaint Chief Complaint  Patient presents with   Cough   Weakness    HPI Charlene Jordan is a 37 y.o. female.   Patient presents today with a 5 to 6-day history of URI symptoms including productive cough, shortness of breath, chest tightness, congestion.  Denies any fever, chest pain, nausea, vomiting, diarrhea.  She has tried multiple over-the-counter medications including Alka-Seltzer cold and flu, Mucinex, Sudafed without improvement of symptoms.  Denies any known sick contacts.  She has had COVID in the past with last episode several years ago.  She has had initial COVID vaccines but not had any boosters.  She denies any recent antibiotics or steroids.  She is confident that she is not pregnant.  Reports that she attempted to go back to work today but was feeling very weak.  She works as a Art gallery manager and if she stood for more than a few minutes she would feel poorly and have to sit down.  She has been eating and drinking normally.  She does have a history of diabetes and has been compliant with her medication.  She has not been checking her blood sugar at home.  Reports she is feeling very poorly prompting evaluation today.    Past Medical History:  Diagnosis Date   Diabetes mellitus without complication ([place])    High cholesterol    Hypertension    Obesity    Ovarian cyst     Patient Active Problem List   Diagnosis Date Noted   RLQ abdominal pain 06/24/2014   Pelvic pain in female 06/24/2014    History reviewed. No pertinent surgical history.  OB History     Gravida  0   Para  0   Term  0   Preterm  0   AB  0   Living         SAB  0   IAB  0   Ectopic  0   Multiple      Live Births               Home Medications    Prior to Admission medications   Medication Sig Start Date End Date Taking? Authorizing Provider  amoxicillin-clavulanate  (AUGMENTIN) 875-125 MG tablet Take 1 tablet by mouth every 12 (twelve) hours. 03/09/22  Yes Geoge Lawrance K, PA-C  azithromycin (ZITHROMAX) 250 MG tablet Take 1 tablet (250 mg total) by mouth daily. Take first 2 tablets together, then 1 every day until finished. 03/09/22  Yes Anastacio Bua K, PA-C  amphetamine-dextroamphetamine (ADDERALL XR) 20 MG 24 hr capsule Take 20 mg by mouth 2 (two) times daily as needed (ADHD).    [provider]  Dulaglutide (TRULICITY) 1.5 0000000 SOPN Inject 0.5 mLs into the skin once a week.     [provider]  glimepiride (AMARYL) 2 MG tablet Take 2 mg by mouth daily.    [provider]  ibuprofen (ADVIL) 600 MG tablet Take 1 tablet (600 mg total) by mouth every 6 (six) hours as needed. 08/02/20   Charlann Lange, PA-C  metFORMIN (GLUCOPHAGE-XR) 500 MG 24 hr tablet Take 1,000 mg by mouth daily.    [provider]  valsartan (DIOVAN) 320 MG tablet Take 320 mg by mouth daily.    [provider]    Family History Family History  Problem Relation Age of Onset  Hypertension Mother    Diabetes Mother    Hypertension Father     Social History Social History   Tobacco Use   Smoking status: Never   Smokeless tobacco: Never  Vaping Use   Vaping Use: Never used  Substance Use Topics   Alcohol use: Yes    Comment: socially   Drug use: No     Allergies   Doxycycline   Review of Systems Review of Systems  Constitutional:  Positive for activity change. Negative for appetite change, fatigue and fever.  HENT:  Positive for congestion. Negative for sinus pressure, sneezing and sore throat.   Respiratory:  Positive for cough, chest tightness and shortness of breath.   Cardiovascular:  Negative for chest pain.  Gastrointestinal:  Negative for abdominal pain, diarrhea, nausea and vomiting.  Neurological:  Positive for weakness (generalized). Negative for dizziness, light-headedness and headaches.     Physical  Exam Triage Vital Signs ED Triage Vitals  Enc Vitals Group     BP 03/09/22 1306 125/86     Pulse Rate 03/09/22 1306 98     Resp 03/09/22 1306 16     Temp 03/09/22 1306 99.1 F (37.3 C)     Temp Source 03/09/22 1306 Oral     SpO2 03/09/22 1306 94 %     Weight --      Height --      Head Circumference --      Peak Flow --      Pain Score 03/09/22 1307 0     Pain Loc --      Pain Edu? --      Excl. in [place]? --    No data found.  Updated Vital Signs BP 125/86 (BP Location: Left Arm)   Pulse 98   Temp 99.1 F (37.3 C) (Oral)   Resp 16   LMP 02/25/2022 (Approximate)   SpO2 94%   Visual Acuity Right Eye Distance:   Left Eye Distance:   Bilateral Distance:    Right Eye Near:   Left Eye Near:    Bilateral Near:     Physical Exam Vitals reviewed.  Constitutional:      General: She is awake. She is not in acute distress.    Appearance: Normal appearance. She is well-developed. She is not ill-appearing.     Comments: Very pleasant female appears stated age and in no acute distress sitting comfortably in exam room  HENT:     Head: Normocephalic and atraumatic.     Right Ear: Tympanic membrane, ear canal and external ear normal. Tympanic membrane is not erythematous or bulging.     Left Ear: Tympanic membrane, ear canal and external ear normal. Tympanic membrane is not erythematous or bulging.     Nose:     Right Sinus: No maxillary sinus tenderness or frontal sinus tenderness.     Left Sinus: No maxillary sinus tenderness or frontal sinus tenderness.     Mouth/Throat:     Pharynx: Uvula midline. No oropharyngeal exudate or posterior oropharyngeal erythema.  Cardiovascular:     Rate and Rhythm: Normal rate and regular rhythm.     Heart sounds: Normal heart sounds, S1 normal and S2 normal. No murmur heard. Pulmonary:     Effort: Pulmonary effort is normal.     Breath sounds: Rhonchi present. No wheezing or rales.     Comments: Rhonchi partially clear with  cough Psychiatric:        Behavior: Behavior is cooperative.  UC Treatments / Results  Labs (all labs ordered are listed, but only abnormal results are displayed) Labs Reviewed  CBG MONITORING, ED - Abnormal; Notable for the following components:      Result Value   Glucose-Capillary 363 (*)    All other components within normal limits  POCT URINALYSIS DIPSTICK, ED / UC - Abnormal; Notable for the following components:   Glucose, UA >=1000 (*)    Ketones, ur 15 (*)    Hgb urine dipstick TRACE (*)    Protein, ur 100 (*)    All other components within normal limits    EKG   Radiology DG Chest 2 View  Result Date: 03/09/2022 CLINICAL DATA:  worsening cough EXAM: CHEST - 2 VIEW COMPARISON:  Chest x-ray December 14, 2012. FINDINGS: Mild patchy airspace opacities in the left mid and basilar lung. No visible pneumothorax. No pleural effusions. Cardiomediastinal silhouette is within normal limits for technique. No acute osseous abnormality. IMPRESSION: Mild patchy airspace opacities in the left mid and basilar lung, suspicious for pneumonia. Electronically Signed   By: Margaretha Sheffield M.D.   On: 03/09/2022 14:23    Procedures Procedures (including critical care time)  Medications Ordered in UC Medications - No data to display  Initial Impression / Assessment and Plan / UC Course  I have reviewed the triage vital signs and the nursing notes.  Pertinent labs & imaging results that were available during my care of the patient were reviewed by me and considered in my medical decision making (see chart for details).     Patient is well-appearing, afebrile, nontoxic, nontachycardic.  She has been symptomatic for over a week so viral testing was deferred as she is outside the window effectiveness for Tamiflu or COVID antivirals.  Chest x-ray was obtained that showed developing pneumonia in left lower lung.  Will cover with Augmentin and azithromycin.  Her blood sugar was elevated  in clinic.  We attempted to obtain CBC and CMP but were unsuccessful so these were ultimately discontinued.  Her urine showed only trace ketones with associated glucosuria and no evidence of infection.  Discussed that she is to push fluids and avoid carbohydrates.  Recommend she monitor her blood sugar at home and if this is persistently elevated return for reevaluation.  She needs to be reevaluated first thing next week with either our clinic or primary care.  Discussed that if in the meantime if she has any worsening symptoms including high fever, worsening cough, shortness of breath, nausea/vomiting interfering with oral intake, weakness she needs to go to the emergency room.  Strict return precautions given.  Work excuse note provided; because that she should not travel given her illness was added to work excuse note as she was scheduled to leave on a cruise but will not be able to do so given her illness.  Final Clinical Impressions(s) / UC Diagnoses   Final diagnoses:  Community acquired pneumonia of left lower lobe of lung  General weakness  Type 2 diabetes mellitus with hyperglycemia, without long-term current use of insulin ([place])     Discharge Instructions      Your x-ray showed that you have pneumonia developing in your left lung.  Start Augmentin twice daily for 7 days.  Take azithromycin as prescribed.  I recommend you take Mucinex and over-the-counter medications such as Tylenol/ibuprofen for additional symptom relief.  Your blood sugar is very elevated.  Please avoid carbohydrates and drink lots of fluid.  It is very important that you  are reevaluated first thing next week.  Please follow-up with either our clinic or your primary care.  If you have any worsening or changing symptoms including worsening cough, shortness of breath, nausea/vomiting, weakness, confusion, chest pain you need to go to the ER.     ED Prescriptions     Medication Sig Dispense Auth. Provider    amoxicillin-clavulanate (AUGMENTIN) 875-125 MG tablet Take 1 tablet by mouth every 12 (twelve) hours. 14 tablet Chyane Greer K, PA-C   azithromycin (ZITHROMAX) 250 MG tablet Take 1 tablet (250 mg total) by mouth daily. Take first 2 tablets together, then 1 every day until finished. 6 tablet Akera Snowberger, Derry Skill, PA-C      PDMP not reviewed this encounter.   Terrilee Croak, PA-C 03/09/22 1606

## 2022-12-24 ENCOUNTER — Other Ambulatory Visit: Payer: Self-pay | Admitting: Family Medicine

## 2022-12-24 DIAGNOSIS — N6314 Unspecified lump in the right breast, lower inner quadrant: Secondary | ICD-10-CM

## 2023-01-10 ENCOUNTER — Ambulatory Visit
Admission: RE | Admit: 2023-01-10 | Discharge: 2023-01-10 | Disposition: A | Discharge status: Home / Self Care | Payer: Commercial Managed Care - HMO | Source: Ambulatory Visit | Attending: Family Medicine | Admitting: Family Medicine

## 2023-01-10 DIAGNOSIS — N6314 Unspecified lump in the right breast, lower inner quadrant: Secondary | ICD-10-CM

## 2023-01-21 DIAGNOSIS — E282 Polycystic ovarian syndrome: Secondary | ICD-10-CM | POA: Diagnosis not present

## 2023-01-21 DIAGNOSIS — E1169 Type 2 diabetes mellitus with other specified complication: Secondary | ICD-10-CM | POA: Diagnosis not present

## 2023-01-21 DIAGNOSIS — E1165 Type 2 diabetes mellitus with hyperglycemia: Secondary | ICD-10-CM | POA: Diagnosis not present

## 2023-01-21 DIAGNOSIS — I10 Essential (primary) hypertension: Secondary | ICD-10-CM | POA: Diagnosis not present

## 2023-01-21 DIAGNOSIS — E119 Type 2 diabetes mellitus without complications: Secondary | ICD-10-CM | POA: Diagnosis not present

## 2023-03-11 DIAGNOSIS — N920 Excessive and frequent menstruation with regular cycle: Secondary | ICD-10-CM | POA: Diagnosis not present

## 2023-03-11 DIAGNOSIS — L292 Pruritus vulvae: Secondary | ICD-10-CM | POA: Diagnosis not present

## 2023-03-11 DIAGNOSIS — N946 Dysmenorrhea, unspecified: Secondary | ICD-10-CM | POA: Diagnosis not present

## 2023-03-25 DIAGNOSIS — F900 Attention-deficit hyperactivity disorder, predominantly inattentive type: Secondary | ICD-10-CM | POA: Diagnosis not present

## 2023-03-25 DIAGNOSIS — F321 Major depressive disorder, single episode, moderate: Secondary | ICD-10-CM | POA: Diagnosis not present

## 2023-03-25 DIAGNOSIS — E785 Hyperlipidemia, unspecified: Secondary | ICD-10-CM | POA: Diagnosis not present

## 2023-03-25 DIAGNOSIS — I10 Essential (primary) hypertension: Secondary | ICD-10-CM | POA: Diagnosis not present

## 2023-03-25 DIAGNOSIS — E1165 Type 2 diabetes mellitus with hyperglycemia: Secondary | ICD-10-CM | POA: Diagnosis not present

## 2023-04-16 DIAGNOSIS — E1165 Type 2 diabetes mellitus with hyperglycemia: Secondary | ICD-10-CM | POA: Diagnosis not present

## 2023-04-16 DIAGNOSIS — E282 Polycystic ovarian syndrome: Secondary | ICD-10-CM | POA: Diagnosis not present

## 2023-04-16 DIAGNOSIS — E119 Type 2 diabetes mellitus without complications: Secondary | ICD-10-CM | POA: Diagnosis not present

## 2023-04-16 DIAGNOSIS — D509 Iron deficiency anemia, unspecified: Secondary | ICD-10-CM | POA: Diagnosis not present

## 2023-04-16 DIAGNOSIS — E1169 Type 2 diabetes mellitus with other specified complication: Secondary | ICD-10-CM | POA: Diagnosis not present

## 2023-06-24 DIAGNOSIS — E1165 Type 2 diabetes mellitus with hyperglycemia: Secondary | ICD-10-CM | POA: Diagnosis not present

## 2023-06-24 DIAGNOSIS — D509 Iron deficiency anemia, unspecified: Secondary | ICD-10-CM | POA: Diagnosis not present

## 2023-06-24 DIAGNOSIS — E559 Vitamin D deficiency, unspecified: Secondary | ICD-10-CM | POA: Diagnosis not present

## 2023-06-25 DIAGNOSIS — Z124 Encounter for screening for malignant neoplasm of cervix: Secondary | ICD-10-CM | POA: Diagnosis not present

## 2023-07-29 DIAGNOSIS — I10 Essential (primary) hypertension: Secondary | ICD-10-CM | POA: Diagnosis not present

## 2023-07-29 DIAGNOSIS — R591 Generalized enlarged lymph nodes: Secondary | ICD-10-CM | POA: Diagnosis not present

## 2023-07-29 DIAGNOSIS — E119 Type 2 diabetes mellitus without complications: Secondary | ICD-10-CM | POA: Diagnosis not present

## 2023-07-29 DIAGNOSIS — F900 Attention-deficit hyperactivity disorder, predominantly inattentive type: Secondary | ICD-10-CM | POA: Diagnosis not present

## 2023-07-29 DIAGNOSIS — E785 Hyperlipidemia, unspecified: Secondary | ICD-10-CM | POA: Diagnosis not present

## 2023-07-29 DIAGNOSIS — F321 Major depressive disorder, single episode, moderate: Secondary | ICD-10-CM | POA: Diagnosis not present

## 2023-09-30 DIAGNOSIS — D509 Iron deficiency anemia, unspecified: Secondary | ICD-10-CM | POA: Diagnosis not present

## 2023-09-30 DIAGNOSIS — E1169 Type 2 diabetes mellitus with other specified complication: Secondary | ICD-10-CM | POA: Diagnosis not present

## 2023-09-30 DIAGNOSIS — E785 Hyperlipidemia, unspecified: Secondary | ICD-10-CM | POA: Diagnosis not present

## 2023-10-01 DIAGNOSIS — I10 Essential (primary) hypertension: Secondary | ICD-10-CM | POA: Diagnosis not present

## 2023-10-01 DIAGNOSIS — N920 Excessive and frequent menstruation with regular cycle: Secondary | ICD-10-CM | POA: Diagnosis not present

## 2023-10-01 DIAGNOSIS — E559 Vitamin D deficiency, unspecified: Secondary | ICD-10-CM | POA: Diagnosis not present

## 2023-10-01 DIAGNOSIS — D509 Iron deficiency anemia, unspecified: Secondary | ICD-10-CM | POA: Diagnosis not present

## 2023-10-01 DIAGNOSIS — E1169 Type 2 diabetes mellitus with other specified complication: Secondary | ICD-10-CM | POA: Diagnosis not present

## 2023-10-01 DIAGNOSIS — E282 Polycystic ovarian syndrome: Secondary | ICD-10-CM | POA: Diagnosis not present

## 2023-10-01 DIAGNOSIS — E1165 Type 2 diabetes mellitus with hyperglycemia: Secondary | ICD-10-CM | POA: Diagnosis not present

## 2023-12-06 DIAGNOSIS — E785 Hyperlipidemia, unspecified: Secondary | ICD-10-CM | POA: Diagnosis not present

## 2023-12-06 DIAGNOSIS — E119 Type 2 diabetes mellitus without complications: Secondary | ICD-10-CM | POA: Diagnosis not present

## 2023-12-06 DIAGNOSIS — Z794 Long term (current) use of insulin: Secondary | ICD-10-CM | POA: Diagnosis not present

## 2023-12-06 DIAGNOSIS — F900 Attention-deficit hyperactivity disorder, predominantly inattentive type: Secondary | ICD-10-CM | POA: Diagnosis not present

## 2023-12-06 DIAGNOSIS — I10 Essential (primary) hypertension: Secondary | ICD-10-CM | POA: Diagnosis not present

## 2023-12-06 DIAGNOSIS — F321 Major depressive disorder, single episode, moderate: Secondary | ICD-10-CM | POA: Diagnosis not present

## 2023-12-16 DIAGNOSIS — D509 Iron deficiency anemia, unspecified: Secondary | ICD-10-CM | POA: Diagnosis not present

## 2023-12-16 DIAGNOSIS — I10 Essential (primary) hypertension: Secondary | ICD-10-CM | POA: Diagnosis not present

## 2023-12-16 DIAGNOSIS — E1165 Type 2 diabetes mellitus with hyperglycemia: Secondary | ICD-10-CM | POA: Diagnosis not present

## 2023-12-16 DIAGNOSIS — E559 Vitamin D deficiency, unspecified: Secondary | ICD-10-CM | POA: Diagnosis not present
